# Patient Record
Sex: Female | Born: 1980 | Race: Black or African American | Hispanic: No | Marital: Married | Smoking: Current every day smoker
Health system: Southern US, Community
[De-identification: ages and names within clinical notes are randomized; demographics above are authoritative.]

## PROBLEM LIST (undated history)

## (undated) DIAGNOSIS — K529 Noninfective gastroenteritis and colitis, unspecified: Secondary | ICD-10-CM

## (undated) DIAGNOSIS — I10 Essential (primary) hypertension: Secondary | ICD-10-CM

## (undated) DIAGNOSIS — D649 Anemia, unspecified: Secondary | ICD-10-CM

## (undated) DIAGNOSIS — R252 Cramp and spasm: Secondary | ICD-10-CM

## (undated) HISTORY — PX: OVARIAN CYST REMOVAL: SHX89

## (undated) HISTORY — DX: Cramp and spasm: R25.2

## (undated) HISTORY — DX: Essential (primary) hypertension: I10

## (undated) HISTORY — DX: Noninfective gastroenteritis and colitis, unspecified: K52.9

---
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
## 2000-02-13 ENCOUNTER — Emergency Department (HOSPITAL_COMMUNITY): Admission: EM | Admit: 2000-02-13 | Discharge: 2000-02-13 | Payer: Self-pay | Admitting: Emergency Medicine

## 2000-07-18 ENCOUNTER — Emergency Department (HOSPITAL_COMMUNITY): Admission: EM | Admit: 2000-07-18 | Discharge: 2000-07-18 | Payer: Self-pay | Admitting: Emergency Medicine

## 2000-07-18 ENCOUNTER — Encounter: Payer: Self-pay | Admitting: Emergency Medicine

## 2000-08-12 ENCOUNTER — Encounter: Payer: Self-pay | Admitting: Emergency Medicine

## 2000-08-12 ENCOUNTER — Emergency Department (HOSPITAL_COMMUNITY): Admission: EM | Admit: 2000-08-12 | Discharge: 2000-08-12 | Payer: Self-pay | Admitting: Emergency Medicine

## 2000-09-27 ENCOUNTER — Emergency Department (HOSPITAL_COMMUNITY): Admission: EM | Admit: 2000-09-27 | Discharge: 2000-09-27 | Payer: Self-pay | Admitting: Emergency Medicine

## 2000-10-02 ENCOUNTER — Emergency Department (HOSPITAL_COMMUNITY): Admission: EM | Admit: 2000-10-02 | Discharge: 2000-10-02 | Payer: Self-pay | Admitting: Emergency Medicine

## 2000-11-20 ENCOUNTER — Inpatient Hospital Stay (HOSPITAL_COMMUNITY): Admission: AD | Admit: 2000-11-20 | Discharge: 2000-11-20 | Payer: Self-pay | Admitting: *Deleted

## 2000-11-29 ENCOUNTER — Other Ambulatory Visit: Admission: RE | Admit: 2000-11-29 | Discharge: 2000-11-29 | Payer: Self-pay | Admitting: Gynecology

## 2001-02-13 ENCOUNTER — Inpatient Hospital Stay (HOSPITAL_COMMUNITY): Admission: AD | Admit: 2001-02-13 | Discharge: 2001-02-13 | Payer: Self-pay | Admitting: *Deleted

## 2001-02-28 ENCOUNTER — Emergency Department (HOSPITAL_COMMUNITY): Admission: EM | Admit: 2001-02-28 | Discharge: 2001-02-28 | Payer: Self-pay | Admitting: Emergency Medicine

## 2001-07-26 ENCOUNTER — Other Ambulatory Visit: Admission: RE | Admit: 2001-07-26 | Discharge: 2001-07-26 | Payer: Self-pay | Admitting: Obstetrics and Gynecology

## 2001-10-05 ENCOUNTER — Emergency Department (HOSPITAL_COMMUNITY): Admission: EM | Admit: 2001-10-05 | Discharge: 2001-10-05 | Payer: Self-pay | Admitting: Emergency Medicine

## 2001-10-07 ENCOUNTER — Ambulatory Visit (HOSPITAL_COMMUNITY): Admission: RE | Admit: 2001-10-07 | Discharge: 2001-10-07 | Payer: Self-pay

## 2001-10-21 ENCOUNTER — Emergency Department (HOSPITAL_COMMUNITY): Admission: EM | Admit: 2001-10-21 | Discharge: 2001-10-21 | Payer: Self-pay | Admitting: Emergency Medicine

## 2001-10-22 ENCOUNTER — Encounter: Payer: Self-pay | Admitting: Emergency Medicine

## 2001-12-05 ENCOUNTER — Emergency Department (HOSPITAL_COMMUNITY): Admission: EM | Admit: 2001-12-05 | Discharge: 2001-12-05 | Payer: Self-pay | Admitting: Emergency Medicine

## 2001-12-05 ENCOUNTER — Encounter: Payer: Self-pay | Admitting: Emergency Medicine

## 2001-12-07 ENCOUNTER — Emergency Department (HOSPITAL_COMMUNITY): Admission: EM | Admit: 2001-12-07 | Discharge: 2001-12-07 | Payer: Self-pay | Admitting: Emergency Medicine

## 2002-01-10 ENCOUNTER — Emergency Department (HOSPITAL_COMMUNITY): Admission: EM | Admit: 2002-01-10 | Discharge: 2002-01-10 | Payer: Self-pay | Admitting: Emergency Medicine

## 2002-01-30 ENCOUNTER — Emergency Department (HOSPITAL_COMMUNITY): Admission: EM | Admit: 2002-01-30 | Discharge: 2002-01-30 | Payer: Self-pay | Admitting: Emergency Medicine

## 2002-04-22 ENCOUNTER — Other Ambulatory Visit: Admission: RE | Admit: 2002-04-22 | Discharge: 2002-04-22 | Payer: Self-pay | Admitting: Obstetrics and Gynecology

## 2002-09-14 ENCOUNTER — Emergency Department (HOSPITAL_COMMUNITY): Admission: EM | Admit: 2002-09-14 | Discharge: 2002-09-15 | Payer: Self-pay

## 2002-09-15 ENCOUNTER — Emergency Department (HOSPITAL_COMMUNITY): Admission: EM | Admit: 2002-09-15 | Discharge: 2002-09-15 | Payer: Self-pay | Admitting: Emergency Medicine

## 2002-09-15 ENCOUNTER — Encounter: Payer: Self-pay | Admitting: Emergency Medicine

## 2002-10-23 ENCOUNTER — Other Ambulatory Visit: Admission: RE | Admit: 2002-10-23 | Discharge: 2002-10-23 | Payer: Self-pay | Admitting: Gynecology

## 2002-12-27 ENCOUNTER — Emergency Department (HOSPITAL_COMMUNITY): Admission: EM | Admit: 2002-12-27 | Discharge: 2002-12-27 | Payer: Self-pay | Admitting: Emergency Medicine

## 2002-12-27 ENCOUNTER — Encounter: Payer: Self-pay | Admitting: Emergency Medicine

## 2003-01-01 ENCOUNTER — Emergency Department (HOSPITAL_COMMUNITY): Admission: EM | Admit: 2003-01-01 | Discharge: 2003-01-01 | Payer: Self-pay | Admitting: Emergency Medicine

## 2003-01-01 ENCOUNTER — Encounter: Payer: Self-pay | Admitting: Emergency Medicine

## 2003-01-01 ENCOUNTER — Emergency Department (HOSPITAL_COMMUNITY): Admission: EM | Admit: 2003-01-01 | Discharge: 2003-01-01 | Payer: Self-pay | Admitting: *Deleted

## 2003-01-15 ENCOUNTER — Encounter: Admission: RE | Admit: 2003-01-15 | Discharge: 2003-01-15 | Payer: Self-pay | Admitting: Internal Medicine

## 2003-02-16 ENCOUNTER — Ambulatory Visit (HOSPITAL_COMMUNITY): Admission: RE | Admit: 2003-02-16 | Discharge: 2003-02-16 | Payer: Self-pay | Admitting: Internal Medicine

## 2003-02-16 ENCOUNTER — Encounter: Payer: Self-pay | Admitting: Internal Medicine

## 2003-02-25 ENCOUNTER — Encounter: Admission: RE | Admit: 2003-02-25 | Discharge: 2003-02-25 | Payer: Self-pay | Admitting: Internal Medicine

## 2003-03-04 ENCOUNTER — Encounter: Admission: RE | Admit: 2003-03-04 | Discharge: 2003-03-04 | Payer: Self-pay | Admitting: Internal Medicine

## 2003-05-28 ENCOUNTER — Other Ambulatory Visit: Admission: RE | Admit: 2003-05-28 | Discharge: 2003-05-28 | Payer: Self-pay | Admitting: Gynecology

## 2003-06-14 ENCOUNTER — Emergency Department (HOSPITAL_COMMUNITY): Admission: EM | Admit: 2003-06-14 | Discharge: 2003-06-15 | Payer: Self-pay | Admitting: Emergency Medicine

## 2003-08-22 ENCOUNTER — Emergency Department (HOSPITAL_COMMUNITY): Admission: EM | Admit: 2003-08-22 | Discharge: 2003-08-22 | Payer: Self-pay | Admitting: *Deleted

## 2003-11-22 ENCOUNTER — Inpatient Hospital Stay (HOSPITAL_COMMUNITY): Admission: AD | Admit: 2003-11-22 | Discharge: 2003-11-22 | Payer: Self-pay | Admitting: Gynecology

## 2003-12-13 ENCOUNTER — Inpatient Hospital Stay (HOSPITAL_COMMUNITY): Admission: AD | Admit: 2003-12-13 | Discharge: 2003-12-13 | Payer: Self-pay | Admitting: Gynecology

## 2003-12-15 ENCOUNTER — Other Ambulatory Visit: Admission: RE | Admit: 2003-12-15 | Discharge: 2003-12-15 | Payer: Self-pay | Admitting: Gynecology

## 2004-01-22 ENCOUNTER — Emergency Department (HOSPITAL_COMMUNITY): Admission: EM | Admit: 2004-01-22 | Discharge: 2004-01-22 | Payer: Self-pay | Admitting: Emergency Medicine

## 2004-03-01 ENCOUNTER — Inpatient Hospital Stay (HOSPITAL_COMMUNITY): Admission: AD | Admit: 2004-03-01 | Discharge: 2004-03-02 | Payer: Self-pay | Admitting: Gynecology

## 2004-05-06 ENCOUNTER — Inpatient Hospital Stay (HOSPITAL_COMMUNITY): Admission: AD | Admit: 2004-05-06 | Discharge: 2004-05-07 | Payer: Self-pay | Admitting: Gynecology

## 2004-05-13 ENCOUNTER — Inpatient Hospital Stay (HOSPITAL_COMMUNITY): Admission: AD | Admit: 2004-05-13 | Discharge: 2004-05-14 | Payer: Self-pay | Admitting: Gynecology

## 2004-06-13 ENCOUNTER — Inpatient Hospital Stay (HOSPITAL_COMMUNITY): Admission: AD | Admit: 2004-06-13 | Discharge: 2004-06-13 | Payer: Self-pay | Admitting: Gynecology

## 2004-07-13 ENCOUNTER — Encounter (INDEPENDENT_AMBULATORY_CARE_PROVIDER_SITE_OTHER): Payer: Self-pay | Admitting: *Deleted

## 2004-07-13 ENCOUNTER — Inpatient Hospital Stay (HOSPITAL_COMMUNITY): Admission: AD | Admit: 2004-07-13 | Discharge: 2004-07-16 | Payer: Self-pay | Admitting: Gynecology

## 2004-07-20 ENCOUNTER — Encounter: Admission: RE | Admit: 2004-07-20 | Discharge: 2004-08-19 | Payer: Self-pay | Admitting: Gynecology

## 2004-08-26 ENCOUNTER — Other Ambulatory Visit: Admission: RE | Admit: 2004-08-26 | Discharge: 2004-08-26 | Payer: Self-pay | Admitting: Gynecology

## 2004-09-17 ENCOUNTER — Encounter: Admission: RE | Admit: 2004-09-17 | Discharge: 2004-10-17 | Payer: Self-pay | Admitting: Gynecology

## 2004-10-20 ENCOUNTER — Emergency Department (HOSPITAL_COMMUNITY): Admission: EM | Admit: 2004-10-20 | Discharge: 2004-10-20 | Payer: Self-pay | Admitting: Emergency Medicine

## 2004-11-17 ENCOUNTER — Encounter: Admission: RE | Admit: 2004-11-17 | Discharge: 2004-12-17 | Payer: Self-pay | Admitting: Gynecology

## 2004-12-04 ENCOUNTER — Emergency Department (HOSPITAL_COMMUNITY): Admission: EM | Admit: 2004-12-04 | Discharge: 2004-12-04 | Payer: Self-pay | Admitting: Emergency Medicine

## 2005-01-17 ENCOUNTER — Encounter: Admission: RE | Admit: 2005-01-17 | Discharge: 2005-02-16 | Payer: Self-pay | Admitting: Gynecology

## 2005-02-17 ENCOUNTER — Encounter: Admission: RE | Admit: 2005-02-17 | Discharge: 2005-03-18 | Payer: Self-pay | Admitting: Gynecology

## 2005-03-19 ENCOUNTER — Encounter: Admission: RE | Admit: 2005-03-19 | Discharge: 2005-04-18 | Payer: Self-pay | Admitting: Gynecology

## 2005-04-19 ENCOUNTER — Encounter: Admission: RE | Admit: 2005-04-19 | Discharge: 2005-05-18 | Payer: Self-pay | Admitting: Gynecology

## 2005-05-19 ENCOUNTER — Emergency Department (HOSPITAL_COMMUNITY): Admission: EM | Admit: 2005-05-19 | Discharge: 2005-05-19 | Payer: Self-pay | Admitting: Emergency Medicine

## 2005-05-19 ENCOUNTER — Encounter: Admission: RE | Admit: 2005-05-19 | Discharge: 2005-06-18 | Payer: Self-pay | Admitting: Gynecology

## 2005-05-27 ENCOUNTER — Inpatient Hospital Stay (HOSPITAL_COMMUNITY): Admission: AD | Admit: 2005-05-27 | Discharge: 2005-05-27 | Payer: Self-pay | Admitting: *Deleted

## 2005-06-19 ENCOUNTER — Encounter: Admission: RE | Admit: 2005-06-19 | Discharge: 2005-07-19 | Payer: Self-pay | Admitting: Gynecology

## 2005-06-19 ENCOUNTER — Inpatient Hospital Stay (HOSPITAL_COMMUNITY): Admission: AD | Admit: 2005-06-19 | Discharge: 2005-06-19 | Payer: Self-pay | Admitting: Obstetrics & Gynecology

## 2005-07-10 ENCOUNTER — Ambulatory Visit (HOSPITAL_COMMUNITY): Admission: RE | Admit: 2005-07-10 | Discharge: 2005-07-10 | Payer: Self-pay | Admitting: Obstetrics

## 2005-07-20 ENCOUNTER — Encounter: Admission: RE | Admit: 2005-07-20 | Discharge: 2005-08-16 | Payer: Self-pay | Admitting: Gynecology

## 2005-08-17 ENCOUNTER — Encounter: Admission: RE | Admit: 2005-08-17 | Discharge: 2005-09-16 | Payer: Self-pay | Admitting: Gynecology

## 2005-08-30 ENCOUNTER — Emergency Department (HOSPITAL_COMMUNITY): Admission: EM | Admit: 2005-08-30 | Discharge: 2005-08-30 | Payer: Self-pay | Admitting: Family Medicine

## 2005-09-15 ENCOUNTER — Inpatient Hospital Stay (HOSPITAL_COMMUNITY): Admission: AD | Admit: 2005-09-15 | Discharge: 2005-09-16 | Payer: Self-pay | Admitting: Obstetrics

## 2005-09-17 ENCOUNTER — Encounter: Admission: RE | Admit: 2005-09-17 | Discharge: 2005-10-06 | Payer: Self-pay | Admitting: Gynecology

## 2005-10-26 ENCOUNTER — Ambulatory Visit (HOSPITAL_COMMUNITY): Admission: RE | Admit: 2005-10-26 | Discharge: 2005-10-26 | Payer: Self-pay | Admitting: Obstetrics & Gynecology

## 2005-12-04 ENCOUNTER — Inpatient Hospital Stay (HOSPITAL_COMMUNITY): Admission: AD | Admit: 2005-12-04 | Discharge: 2005-12-05 | Payer: Self-pay | Admitting: Obstetrics

## 2005-12-11 ENCOUNTER — Ambulatory Visit (HOSPITAL_COMMUNITY): Admission: RE | Admit: 2005-12-11 | Discharge: 2005-12-11 | Payer: Self-pay | Admitting: Obstetrics & Gynecology

## 2005-12-12 ENCOUNTER — Inpatient Hospital Stay (HOSPITAL_COMMUNITY): Admission: AD | Admit: 2005-12-12 | Discharge: 2005-12-17 | Payer: Self-pay | Admitting: Obstetrics

## 2005-12-14 ENCOUNTER — Encounter (INDEPENDENT_AMBULATORY_CARE_PROVIDER_SITE_OTHER): Payer: Self-pay | Admitting: Specialist
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
## 2006-02-05 ENCOUNTER — Emergency Department (HOSPITAL_COMMUNITY): Admission: EM | Admit: 2006-02-05 | Discharge: 2006-02-05 | Payer: Self-pay | Admitting: Emergency Medicine

## 2006-08-15 ENCOUNTER — Inpatient Hospital Stay (HOSPITAL_COMMUNITY): Admission: AD | Admit: 2006-08-15 | Discharge: 2006-08-16 | Payer: Self-pay | Admitting: Obstetrics

## 2006-08-18 ENCOUNTER — Emergency Department (HOSPITAL_COMMUNITY): Admission: EM | Admit: 2006-08-18 | Discharge: 2006-08-18 | Payer: Self-pay | Admitting: Emergency Medicine

## 2007-05-18 ENCOUNTER — Inpatient Hospital Stay (HOSPITAL_COMMUNITY): Admission: AD | Admit: 2007-05-18 | Discharge: 2007-05-18 | Payer: Self-pay | Admitting: Obstetrics

## 2007-09-02 ENCOUNTER — Inpatient Hospital Stay (HOSPITAL_COMMUNITY): Admission: AD | Admit: 2007-09-02 | Discharge: 2007-09-02 | Payer: Self-pay | Admitting: Obstetrics

## 2007-09-07 ENCOUNTER — Inpatient Hospital Stay (HOSPITAL_COMMUNITY): Admission: AD | Admit: 2007-09-07 | Discharge: 2007-09-07 | Payer: Self-pay | Admitting: Obstetrics

## 2007-10-18 ENCOUNTER — Ambulatory Visit (HOSPITAL_COMMUNITY): Admission: RE | Admit: 2007-10-18 | Discharge: 2007-10-18 | Payer: Self-pay | Admitting: Obstetrics & Gynecology

## 2008-01-08 ENCOUNTER — Ambulatory Visit (HOSPITAL_COMMUNITY): Admission: RE | Admit: 2008-01-08 | Discharge: 2008-01-08 | Payer: Self-pay | Admitting: Obstetrics & Gynecology

## 2008-02-20 ENCOUNTER — Ambulatory Visit: Payer: Self-pay | Admitting: Family Medicine

## 2008-02-20 DIAGNOSIS — R634 Abnormal weight loss: Secondary | ICD-10-CM | POA: Insufficient documentation

## 2008-02-27 ENCOUNTER — Ambulatory Visit: Payer: Self-pay | Admitting: Family Medicine

## 2008-03-12 ENCOUNTER — Inpatient Hospital Stay (HOSPITAL_COMMUNITY): Admission: RE | Admit: 2008-03-12 | Discharge: 2008-03-15 | Payer: Self-pay | Admitting: Obstetrics & Gynecology

## 2008-03-24 ENCOUNTER — Inpatient Hospital Stay (HOSPITAL_COMMUNITY): Admission: AD | Admit: 2008-03-24 | Discharge: 2008-03-24 | Payer: Self-pay | Admitting: Obstetrics

## 2008-09-15 ENCOUNTER — Emergency Department (HOSPITAL_COMMUNITY): Admission: EM | Admit: 2008-09-15 | Discharge: 2008-09-15 | Payer: Self-pay | Admitting: Family Medicine

## 2008-10-31 ENCOUNTER — Emergency Department (HOSPITAL_COMMUNITY): Admission: EM | Admit: 2008-10-31 | Discharge: 2008-10-31 | Payer: Self-pay | Admitting: Family Medicine

## 2008-11-14 ENCOUNTER — Inpatient Hospital Stay (HOSPITAL_COMMUNITY): Admission: AD | Admit: 2008-11-14 | Discharge: 2008-11-14 | Payer: Self-pay | Admitting: Obstetrics & Gynecology

## 2008-11-15 ENCOUNTER — Inpatient Hospital Stay (HOSPITAL_COMMUNITY): Admission: AD | Admit: 2008-11-15 | Discharge: 2008-11-15 | Payer: Self-pay | Admitting: Obstetrics & Gynecology

## 2008-12-31 ENCOUNTER — Inpatient Hospital Stay (HOSPITAL_COMMUNITY): Admission: AD | Admit: 2008-12-31 | Discharge: 2009-01-01 | Payer: Self-pay | Admitting: Obstetrics & Gynecology

## 2008-12-31 ENCOUNTER — Ambulatory Visit: Payer: Self-pay | Admitting: Advanced Practice Midwife

## 2009-03-15 ENCOUNTER — Inpatient Hospital Stay (HOSPITAL_COMMUNITY): Admission: AD | Admit: 2009-03-15 | Discharge: 2009-03-15 | Payer: Self-pay | Admitting: Obstetrics & Gynecology

## 2009-05-21 ENCOUNTER — Inpatient Hospital Stay (HOSPITAL_COMMUNITY): Admission: AD | Admit: 2009-05-21 | Discharge: 2009-05-21 | Payer: Self-pay | Admitting: Obstetrics

## 2009-07-12 ENCOUNTER — Ambulatory Visit: Payer: Self-pay | Admitting: Physician Assistant

## 2009-07-12 ENCOUNTER — Inpatient Hospital Stay (HOSPITAL_COMMUNITY): Admission: AD | Admit: 2009-07-12 | Discharge: 2009-07-12 | Payer: Self-pay | Admitting: Obstetrics & Gynecology

## 2009-09-21 ENCOUNTER — Emergency Department (HOSPITAL_COMMUNITY): Admission: EM | Admit: 2009-09-21 | Discharge: 2009-09-22 | Payer: Self-pay | Admitting: Emergency Medicine

## 2009-12-05 ENCOUNTER — Ambulatory Visit: Payer: Self-pay | Admitting: Nurse Practitioner

## 2009-12-05 ENCOUNTER — Inpatient Hospital Stay (HOSPITAL_COMMUNITY): Admission: AD | Admit: 2009-12-05 | Discharge: 2009-12-06 | Payer: Self-pay | Admitting: Family Medicine

## 2010-04-19 ENCOUNTER — Inpatient Hospital Stay (HOSPITAL_COMMUNITY): Admission: AD | Admit: 2010-04-19 | Discharge: 2010-04-19 | Payer: Self-pay | Admitting: Obstetrics & Gynecology

## 2010-04-20 ENCOUNTER — Inpatient Hospital Stay (HOSPITAL_COMMUNITY): Admission: AD | Admit: 2010-04-20 | Discharge: 2010-04-20 | Payer: Self-pay | Admitting: Obstetrics & Gynecology

## 2010-08-16 LAB — URINALYSIS, ROUTINE W REFLEX MICROSCOPIC
Bilirubin Urine: NEGATIVE
Glucose, UA: NEGATIVE mg/dL
Ketones, ur: NEGATIVE mg/dL
Leukocytes, UA: NEGATIVE
Nitrite: NEGATIVE
Protein, ur: NEGATIVE mg/dL
Specific Gravity, Urine: >1.03 — ABNORMAL HIGH (ref 1.005–1.030)
Urobilinogen, UA: 0.2 mg/dL (ref 0.0–1.0)
pH: 6 (ref 5.0–8.0)

## 2010-08-16 LAB — GC/CHLAMYDIA PROBE AMP, GENITAL
Chlamydia, DNA Probe: NEGATIVE
GC Probe Amp, Genital: NEGATIVE

## 2010-08-16 LAB — WET PREP, GENITAL
Clue Cells Wet Prep HPF POC: NONE SEEN
Trich, Wet Prep: NONE SEEN

## 2010-08-16 LAB — COMPREHENSIVE METABOLIC PANEL
ALT: 11 U/L (ref 0–35)
AST: 15 U/L (ref 0–37)
Albumin: 3.4 g/dL — ABNORMAL LOW (ref 3.5–5.2)
Calcium: 9.4 mg/dL (ref 8.4–10.5)
GFR calc Af Amer: >60 mL/min (ref >60)
Sodium: 137 mEq/L (ref 135–145)
Total Protein: 6.9 g/dL (ref 6.0–8.3)

## 2010-08-16 LAB — CBC
Hemoglobin: 11 g/dL — ABNORMAL LOW (ref 12.0–15.0)
MCHC: 31.9 g/dL (ref 30.0–36.0)
Platelets: 285 10*3/uL (ref 150–400)
RDW: 15.9 % — ABNORMAL HIGH (ref 11.5–15.5)

## 2010-08-16 LAB — URINE MICROSCOPIC-ADD ON

## 2010-08-21 LAB — CBC
Hemoglobin: 10.6 g/dL — ABNORMAL LOW (ref 12.0–15.0)
MCH: 22.1 pg — ABNORMAL LOW (ref 26.0–34.0)
MCV: 68.5 fL — ABNORMAL LOW (ref 78.0–100.0)
RBC: 4.81 MIL/uL (ref 3.87–5.11)
WBC: 5.3 10*3/uL (ref 4.0–10.5)

## 2010-08-21 LAB — URINALYSIS, ROUTINE W REFLEX MICROSCOPIC
Bilirubin Urine: NEGATIVE
Glucose, UA: NEGATIVE mg/dL
Ketones, ur: NEGATIVE mg/dL
pH: 6 (ref 5.0–8.0)

## 2010-08-21 LAB — WET PREP, GENITAL: Trich, Wet Prep: NONE SEEN

## 2010-08-21 LAB — COMPREHENSIVE METABOLIC PANEL
AST: 15 U/L (ref 0–37)
Alkaline Phosphatase: 78 U/L (ref 39–117)
BUN: 14 mg/dL (ref 6–23)
CO2: 30 mEq/L (ref 19–32)
Chloride: 106 mEq/L (ref 96–112)
Creatinine, Ser: 0.67 mg/dL (ref 0.4–1.2)
GFR calc Af Amer: >60 mL/min (ref >60)
GFR calc non Af Amer: >60 mL/min (ref >60)
Potassium: 4.1 mEq/L (ref 3.5–5.1)
Total Bilirubin: 0.4 mg/dL (ref 0.3–1.2)

## 2010-08-21 LAB — DIFFERENTIAL
Basophils Absolute: 0 10*3/uL (ref 0.0–0.1)
Basophils Relative: 1 % (ref 0–1)
Eosinophils Absolute: 0.1 10*3/uL (ref 0.0–0.7)
Eosinophils Relative: 3 % (ref 0–5)
Lymphocytes Relative: 29 % (ref 12–46)
Monocytes Absolute: 0.5 10*3/uL (ref 0.1–1.0)

## 2010-08-21 LAB — GC/CHLAMYDIA PROBE AMP, GENITAL: GC Probe Amp, Genital: NEGATIVE

## 2010-08-24 LAB — URINALYSIS, ROUTINE W REFLEX MICROSCOPIC
Glucose, UA: NEGATIVE mg/dL
Hgb urine dipstick: NEGATIVE
Specific Gravity, Urine: 1.02 (ref 1.005–1.030)
pH: 7 (ref 5.0–8.0)

## 2010-08-24 LAB — DIFFERENTIAL
Basophils Absolute: 0 10*3/uL (ref 0.0–0.1)
Basophils Relative: 1 % (ref 0–1)
Eosinophils Absolute: 0.4 10*3/uL (ref 0.0–0.7)
Monocytes Absolute: 0.5 10*3/uL (ref 0.1–1.0)
Monocytes Relative: 10 % (ref 3–12)
Neutro Abs: 2.2 10*3/uL (ref 1.7–7.7)
Neutrophils Relative %: 46 % (ref 43–77)

## 2010-08-24 LAB — COMPREHENSIVE METABOLIC PANEL
ALT: 11 U/L (ref 0–35)
Alkaline Phosphatase: 55 U/L (ref 39–117)
BUN: 9 mg/dL (ref 6–23)
Chloride: 105 mEq/L (ref 96–112)
Glucose, Bld: 91 mg/dL (ref 70–99)
Potassium: 3.7 mEq/L (ref 3.5–5.1)
Sodium: 138 mEq/L (ref 135–145)
Total Bilirubin: 0.2 mg/dL — ABNORMAL LOW (ref 0.3–1.2)
Total Protein: 6.5 g/dL (ref 6.0–8.3)

## 2010-08-24 LAB — CBC
HCT: 31.7 % — ABNORMAL LOW (ref 36.0–46.0)
Hemoglobin: 9.8 g/dL — ABNORMAL LOW (ref 12.0–15.0)
RBC: 4.34 MIL/uL (ref 3.87–5.11)
RDW: 17.7 % — ABNORMAL HIGH (ref 11.5–15.5)
WBC: 4.8 10*3/uL (ref 4.0–10.5)

## 2010-08-24 LAB — POCT PREGNANCY, URINE: Preg Test, Ur: NEGATIVE

## 2010-09-05 LAB — URINALYSIS, ROUTINE W REFLEX MICROSCOPIC
Bilirubin Urine: NEGATIVE
Hgb urine dipstick: NEGATIVE
Nitrite: NEGATIVE
Protein, ur: NEGATIVE mg/dL
Urobilinogen, UA: 0.2 mg/dL (ref 0.0–1.0)

## 2010-09-05 LAB — WET PREP, GENITAL: Trich, Wet Prep: NONE SEEN

## 2010-09-08 LAB — POCT PREGNANCY, URINE: Preg Test, Ur: NEGATIVE

## 2010-09-08 LAB — URINALYSIS, ROUTINE W REFLEX MICROSCOPIC
Nitrite: NEGATIVE
Protein, ur: NEGATIVE mg/dL
Specific Gravity, Urine: 1.02 (ref 1.005–1.030)
Urobilinogen, UA: 1 mg/dL (ref 0.0–1.0)

## 2010-09-08 LAB — WET PREP, GENITAL: Trich, Wet Prep: NONE SEEN

## 2010-09-11 LAB — WET PREP, GENITAL: Trich, Wet Prep: NONE SEEN

## 2010-09-11 LAB — URINALYSIS, ROUTINE W REFLEX MICROSCOPIC
Leukocytes, UA: NEGATIVE
Nitrite: NEGATIVE
Protein, ur: NEGATIVE mg/dL
Specific Gravity, Urine: 1.02 (ref 1.005–1.030)
Urobilinogen, UA: 0.2 mg/dL (ref 0.0–1.0)

## 2010-09-11 LAB — URINE MICROSCOPIC-ADD ON: WBC, UA: NONE SEEN WBC/hpf (ref <3)

## 2010-09-11 LAB — GC/CHLAMYDIA PROBE AMP, GENITAL: Chlamydia, DNA Probe: NEGATIVE

## 2010-09-12 LAB — URINALYSIS, ROUTINE W REFLEX MICROSCOPIC
Glucose, UA: NEGATIVE mg/dL
Protein, ur: NEGATIVE mg/dL
Specific Gravity, Urine: 1.02 (ref 1.005–1.030)
Urobilinogen, UA: 0.2 mg/dL (ref 0.0–1.0)

## 2010-09-12 LAB — WET PREP, GENITAL
WBC, Wet Prep HPF POC: NONE SEEN
Yeast Wet Prep HPF POC: NONE SEEN

## 2010-09-12 LAB — POCT PREGNANCY, URINE: Preg Test, Ur: NEGATIVE

## 2010-09-13 LAB — POCT URINALYSIS DIP (DEVICE)
Nitrite: NEGATIVE
Protein, ur: 30 mg/dL — AB
pH: 6.5 (ref 5.0–8.0)

## 2010-09-13 LAB — POCT PREGNANCY, URINE: Preg Test, Ur: NEGATIVE

## 2010-10-18 NOTE — H&P (Signed)
NAMEKHRISTINA, [name] NO.:  [id number]   MEDICAL RECORD NO.:  [id number]          PATIENT TYPE:  INP   LOCATION:                                FACILITY[facility]  [facility]   PHYSICIAN:  Roseanna Rainbow, M.D.DATE OF BIRTH:  May 17, 1981   DATE OF ADMISSION:  03/12/2008  DATE OF DISCHARGE:                              HISTORY & PHYSICAL   CHIEF COMPLAINT:  The patient is a 30 year old para 2 with an estimated  date of confinement of October 11 with an intrauterine pregnancy at 39+  weeks for elective repeat cesarean delivery.   HISTORY OF PRESENT ILLNESS:  Please see the above.   ALLERGIES:  No known drug allergies.   MEDICATIONS:  Please see the medication reconciliation form.   CURRENT OBSTETRICAL RISK FACTORS:  Herpes type 2, uterine fibroids,  history of previous cesarean deliveries, chlamydial infection.   PAST OBSTETRICAL HISTORY:  IN February 2006 she was delivered of a live  born female, full term, 6 pounds 1 ounce via cesarean delivery for  failure to dilate.  In July of 2007 she had a failed a trial of labor.  She was delivered of a live born female 7 pounds 14 ounces via cesarean  delivery.   PRENATAL LABORATORIES:  Blood type is O+, antibody screen negative.  Chlamydia DNA probe in May was positive.  Test of cure in September was  negative.  Urine culture and sensitivity insignificant growth.  Pap  smear negative.  GC probe negative.  1-hour GCT 75.  Hepatitis B surface  antigen negative, hematocrit 31, hemoglobin 9.8, HIV nonreactive.  Quad  screen negative, platelets 288,000, RPR nonreactive, rubella immune.  Sickle cell negative.  Varicella immune.   PAST GYNECOLOGICAL HISTORY:  1. Ovarian cystectomy.  2. History of abnormal Pap smears.  3. Colposcopy.   PAST MEDICAL HISTORY:  Anemia.   PAST SURGICAL HISTORY:  Please see the above.   SOCIAL HISTORY:  She is a Lawyer.  She is single, does not give any  significant history of alcohol usage.   Previously smoked less than 1/2  pack per day.  There is a history of occasional marijuana use in the  past.   FAMILY HISTORY:  Remarkable for adult-onset diabetes.   PHYSICAL EXAM:  VITAL SIGNS: Blood pressure 113/65, weight 162 pounds.  Fetal heart rate by Doppler 140.  GENERAL:  Well-developed, well-nourished, no apparent distress.  ABDOMEN:  Gravid, nontender.  PELVIC:  Exam deferred.   ASSESSMENT:  Multipara at term with a history of two previous cesarean  deliveries, desires a sterilization procedure.   PLAN:  1. Admission.  2. Repeat cesarean delivery.  The risks, benefits and alternative      forms of management were reviewed with the patient and informed      consent had been obtained.      Roseanna Rainbow, M.D.  Electronically Signed     LAJ/MEDQ  D:  03/11/2008  T:  03/11/2008  Job:  [id number]

## 2010-10-18 NOTE — Op Note (Signed)
[name], [name]              ACCOUNT NO.:  [id number]   MEDICAL RECORD NO.:  [id number]          PATIENT TYPE:  INP   LOCATION:  9141                          FACILITY:  [facility]   PHYSICIAN:  Roseanna Rainbow, M.D.DATE OF BIRTH:  1980/12/12   DATE OF PROCEDURE:  03/12/2008  DATE OF DISCHARGE:                               OPERATIVE REPORT   PREOPERATIVE DIAGNOSES:  Intrauterine pregnancy at term, history of 2  previous cesarean delivery.   POSTOPERATIVE DIAGNOSES:  Intrauterine pregnancy at term, history of 2  previous cesarean delivery, hypertrophic scar.   PROCEDURE:  Repeat cesarean delivery and scar revision.   SURGEON:  Roseanna Rainbow, MD   ANESTHESIA:  Spinal.   ESTIMATED BLOOD LOSS:  600 mL.   COMPLICATIONS:  None.   PROCEDURE:  The patient was taken to the operating room with an IV  running.  She was placed in the dorsal supine position with a leftward  tilt and prepped and draped in the usual sterile fashion.  After a time-  out had been completed, the previous scar was excised with the scalpel.  The incision was then extended down to the fascia with the Bovie.  The  fascia was then incised along the length of the incision.  The superior  aspect of the fascial incision was tented up and the underlying rectus  muscles dissected off.  The inferior aspect of the fascial incision was  manipulated in a similar fashion.  The rectus muscles were separated in  the midline.  The parietal peritoneum was tented up and entered sharply.  This incision was then extended superiorly and inferiorly with good  visualization of the bladder.  The bladder blade was placed.  The  vesicouterine peritoneum was tented up and entered sharply.  This  incision was then extended bilaterally and the bladder flap created  sharply.  The lower uterine segment was incised in a transverse fashion  with a scalpel.  This incision was then extended bluntly.  The infant's  head was then  delivered with vacuum assistance.  The infant's head was  delivered atraumatically.  The oropharynx was suctioned with bulb  suction.  The cord was clamped and cut.  The infant was handed off to  the awaiting neonatologist.  The placenta was then removed.  The  intrauterine cavity was evacuated of any remaining amniotic fluid, clots  and debris with moist laparotomy sponge.  The uterine incision was then  reapproximated in a running interlocking fashion using a suture of 0  Monocryl.  A second imbricating layer of the same suture was then placed  for adequate hemostasis.  The paracolic gutters were then copiously  irrigated.  The parietal peritoneum was then reapproximated in a running  fashion using a suture of 2-0 Vicryl.  The fascia was closed in a  running fashion using 2 sutures of 0 PDS.  The skin was closed in a  subcuticular fashion using suture of 3-0 Monocryl.  At the close of the  procedure, the instrument pack counts were said to be correct x2.  The  patient was taken to the  PACU awake and in stable condition.      Roseanna Rainbow, M.D.  Electronically Signed     LAJ/MEDQ  D:  03/12/2008  T:  03/12/2008  Job:  [id number]

## 2010-10-21 NOTE — Op Note (Signed)
NAMEGENEVIEVE, ARBAUGH              ACCOUNT NO.:  [id number]   MEDICAL RECORD NO.:  [id number]          PATIENT TYPE:  INP   LOCATION:  9171                          [facility]ILITY[facility]   PHYSICIAN:  Roseanna Rainbow, M.D.DATE OF BIRTH:  02/12/1981   DATE OF PROCEDURE:  12/14/2005  DATE OF DISCHARGE:                                 OPERATIVE REPORT   PREOPERATIVE DIAGNOSES:  1.  Intrauterine pregnancy term.  2.  Failed induction of labor.  3.  History of previous cesarean delivery.  4.  Keloid of previous skin incision.   POSTOPERATIVE DIAGNOSIS:  1.  Intrauterine pregnancy term.  2.  Failed induction of labor.  3.  History of previous cesarean delivery.  4.  Keloid.  5.  Repeat cesarean delivery.  6.  Scar revision.   SURGEON:  Dr. Tamela Oddi.   ANESTHESIA:  Epidural.   PATHOLOGY:  Placenta.   ESTIMATED BLOOD LOSS:  600 mL.   COMPLICATIONS:  None.   PROCEDURE:  The patient was taken to the operating room with an epidural  catheter in site.  The epidural was dosed.  She was placed in the dorsal  supine position with left tilt and prepped and draped in the usual sterile  fashion.  The previous keloid scar was excised.  The incision was extended  down to the fascia with Bovie.  The fascia was nicked in the midline.  The  fascial incision was then extended bilaterally with curved Mayo scissors.  The superior aspect of the fascial incision was tented up, and the  underlying rectus muscles dissected off.  The inferior aspect of the fascial  incision was manipulated in a similar fashion.  The rectus muscles were  separated in the midline.  The parietoperitoneum was tented up and entered  sharply with Metzenbaum scissors.  This incision was then extended  superiorly and inferiorly with good visualization of the bladder.  A bladder  blade was then placed.  The vesicouterine peritoneum was tented up and  entered sharply with the scissors.  This incision was then extended  bilaterally, and the bladder flap was created sharply.  Please note this  lower uterine segment was very thin and attenuated.  The lower uterine  segment was then incised in a transverse fashion with the scalpel.  This  incision was then extended bluntly.  The infant head was delivered  atraumatically.  The oropharynx was suctioned with bulb suction.  Cord was  clamped and cut.  The infant was handed off to the waiting neonatologist.  She was delivered of a live female, weight 7 pounds 14 ounces.  Apgars 9 at  1 and 5 minutes, respectively.  The placenta was then removed.  The  intrauterine cavity was evacuated of any remaining amniotic fluid, clots and  debris with laparotomy sponge.  The uterine incision was then reapproximated  in a running interlocking fashion using #0 Monocryl.  A second imbricating  layer was then placed using a suture of the same.  The paracolic gutters  were then irrigated, and the parietoperitoneum was reapproximated in running  fashion, 2-0 Vicryl.  The  fascia was reapproximated in a running fashion  with #0 PDS.  At this point the subcutaneous layer incision was infiltrated  with Kenalog mixed with 0.25% Marcaine.  The skin was then reapproximated in  a subcuticular fashion using 3-0 Monocryl.  At the close of the procedure,  the instrument and pad counts were said to be correct x2.  Cephazolin 1 gram  had been given at cord clamp.  The patient was taken to the PACU awake and  in stable condition.      Roseanna Rainbow, M.D.  Electronically Signed     LAJ/MEDQ  D:  12/14/2005  T:  12/14/2005  Job:  [id number]

## 2010-10-21 NOTE — Consult Note (Signed)
[name], [name]              ACCOUNT NO.:  [id number]   MEDICAL RECORD NO.:  [id number]          PATIENT TYPE:  MAT   LOCATION:  [facility]C                          [facility]ILITY:  [facility]   PHYSICIAN:  Juan H. Lily Peer, M.D.DATE OF BIRTH:  [birth year]   DATE OF CONSULTATION:  03/02/2004  DATE OF DISCHARGE:                                   CONSULTATION   HISTORY:  The patient is a 30 year old, gravida 1, para 0, who presented to  [facility] this morning complaining of lower abdominal discomfort  which later radiated towards her back.   VITAL SIGNS:  Blood pressure of 123/57, temperature 98.4, pulse 72,  respirations 20.   The patient had been placed on the monitor.  There was no evidence of  contractions.  She had a urinalysis that was obtained, clean catch, which  demonstrated a specific gravity of 1.020, and pH of 6.5.  All parameters  were negative.  A wet prep also had been done with a few WBC's and moderate  bacteria, but no yeast or trichomoniasis or evidence of bacterial vaginosis  was evident.  An ultrasound was done, which demonstrated she had a cervical  lymph measurement of 4.8 cm, and she does have a fibroid near the cervical  os measuring 5.1 x 4.2 x 5.1 cm.  Fetal cardiac activity was recorded at 146  beats per minute.  The placenta was anterior, grade 1, with no  abnormalities, and the amniotic fluid index was normal for a gestational age  of [redacted] weeks.   PHYSICAL EXAMINATION:  LUNGS:  Her lungs were clear to auscultation.  HEART:  Regular rate and rhythm.  No murmurs or gallops.  ABDOMEN:  There was no CVA tenderness.  Abdomen was soft.  No rebound or  guarding.  Upon lateral displacement of the uterus, the discomfort can be  reproduced on her left side, consistent with round ligament syndrome.  PELVIC:  Cervix long, closed, and posterior.  EXTREMITIES:  No edema and no clonus.   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  Will continue to monitor this in the office, and, in the event  that it becomes too large or a problem with vaginal delivery, we discussed  that there could be a possibility she could deliver this child when the time  comes via cesarean section.  We will need to readdress this in the office.  She will be given a note to stay off her feet and rest from work for the  next couple of days.  She will begin Ambien 10 mg just like tonight.  I have  given her a prescription for Tylox to take one p.o. q.4-6h., and have given  her 10 tablets.      JHF/MEDQ  D:  03/02/2004  T:  03/02/2004  Job:  [id number]

## 2010-10-21 NOTE — Discharge Summary (Signed)
[name], [name]              ACCOUNT NO.:  [id number]   MEDICAL RECORD NO.:  [id number]          PATIENT TYPE:  INP   LOCATION:  9121                          [facility]ILITY[facility]   PHYSICIAN:  Charles A. Clearance Coots, M.D.DATE OF BIRTH:  1980-06-21   DATE OF ADMISSION:  12/12/2005  DATE OF DISCHARGE:  12/17/2005                                 DISCHARGE SUMMARY   ADMITTING DIAGNOSIS:  Intrauterine pregnancy at term, failed induction of  labor, previous cesarean section.   DISCHARGE DIAGNOSIS:  Intrauterine pregnancy at term, failed induction of  labor, previous cesarean section, status post repeat low transverse cesarean  section on December 13, 2005.  A viable female was delivered at 0718.  Apgars  were 9 at 1 minute and 9 at 5 minutes, weight of 3595 grams, length of 53.5  cm.  Mother and infant were discharged home in good condition.   REASON FOR ADMISSION:  A 30 year old, G2 P10, black female, estimated date of  confinement was December 07, 2005, presented for a prenatal visit.  The patient  was having irregular uterine contractions.  Ultrasound done on December 11, 2005,  revealed AFI of 5.9 cm.  The patient was, therefore, admitted to the  hospital for induction of labor for oligohydramnios and postdate.  She did  have a history of a previous cesarean section and she wanted a trial of  labor.   PAST MEDICAL HISTORY:  Surgery:  Cesarean section, laparoscopy.  Illnesses:  Bronchitis, urinary tract infection, genital herpes.   MEDICATIONS:  Prenatal vitamins and Valtrex.   ALLERGIES:  NO KNOWN DRUG ALLERGIES.   SOCIAL HISTORY:  Single.  Positive tobacco and marijuana.  Negative alcohol.   PHYSICAL EXAMINATION:  GENERAL:  A well-nourished, well-developed female in  no acute distress.  VITAL SIGNS:  Temperature 97, pulse 83, respiratory rate 28, blood pressure  115/62.  LUNGS:  Lung were clear to auscultation bilaterally.  HEART:  Regular rate and rhythm.  ABDOMEN:  Gravid, nontender.  PELVIC:  Cervix 1-2 cm dilated, 50% effaced, vertex at a -3 station.   ADMITTING LABORATORY VALUES:  Hemoglobin 11.4, hematocrit 35.5, white blood  cell count 8300, platelets 288,000.   HOSPITAL COURSE:  The patient was admitted for induction of labor.  Pitocin  was started and she progressed to 1-2 cm and 80% effacement, and at that  time, active rupture of membranes was performed and light meconium-stained  fluid was expelled.  An intrauterine pressure catheter was placed.  The  patient continued in labor.  There was no change in the patient's  examination over the next eight hours.  She continued to have no change in  her examination over approximately 24 hours, and the decision was made to  proceed with cesarean section delivery for failed induction.  Repeat low  transverse cesarean section was performed on December 14, 2005.  There were no  intraoperative complications.  Postoperative course was uncomplicated and  the patient was discharged home on postoperative day #3 in good condition.   DISCHARGE LABORATORY VALUES:  Hemoglobin 8.5, hematocrit 26.3, white blood  cell count 8500, platelets 231,000.  DISCHARGE DISPOSITION:  Medications:  Tylox and ibuprofen were prescribed  for pain, iron was prescribed for anemia which was clinically stable at the  time of discharge, and the patient was also to continue to take her prenatal  vitamins.  Routine written instructions were given for discharge after  cesarean section.  The patient is to call the office for follow-up  appointment in two weeks.      Charles A. Clearance Coots, M.D.  Electronically Signed     CAH/MEDQ  D:  01/25/2006  T:  01/25/2006  Job:  [id number]

## 2010-10-21 NOTE — Op Note (Signed)
NAMEFERNANDE, [name]              ACCOUNT NO.:  [id number]   MEDICAL RECORD NO.:  [id number]          PATIENT TYPE:  INP   LOCATION:  9128                          [facility]ILITY[facility]   PHYSICIAN:  Timothy P. 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   OPERATION PERFORMED:  Primary low transverse cervical cesarean section.   SURGEON:  Timothy P. Fontaine, M.D.   ASSISTANT:  Scrub technician.   ANESTHESIA:  Spinal.   ESTIMATED BLOOD LOSS:  Less than 500 mL.   COMPLICATIONS:  None.   SPECIMENS:  Samples of cord blood, placenta.   FINDINGS:  At 20, normal female, Apgars 9 and 9, nuchal cord times one  noted, 4 cm lower uterine segment anterior fibroid noted.  Left fallopian  tube and ovary grossly normal.  Right fallopian tube absent, could not  identify proximal or distal segment.  Right ovary grossly normal.  No  evidence of pelvic adhesive disease or other pathology.   INDICATIONS FOR PROCEDURE:  The patient is a 30 year old G1, P0, Pitocin  augmented labor contracting every two minutes with reactive fetal tracing.  The patient's cervix has remained unchanged with the vertex unengaged,  cervix fingertip and long.  discussed with the patient and family my fear  was that her leiomyomata was obstructing the head moving into the pelvis and  did not feel continued Pitocin augmentation prudent, given failure to  respond over a four to five hour period.  The patient and family agreed.  She was counseled as to the risks to  include bleeding, transfusion,  infection, wound complications requiring reoperation, opening and draining  of incisions, closure by secondary intention, risks of inadvertent injury to  internal organs including bowel, bladder, ureters, vessels and nerves  necessitating major exploratory reparative surgeries, future reparative  surgeries, the risk of fetal injury, to include musculoskeletal, neural,  scalpel, realistic risks for transfusion, given the leiomyomata as well as  possible classical incision requiring subsequent C-sections as well as risks  of uterine rupture in the future.  All of this was understood and accepted.   DESCRIPTION OF PROCEDURE:  The patient was taken to the operating room and  underwent spinal anesthesia and was placed in the left tilt supine position,  receiving an abdominal preparation with Betadine solution.  Bladder emptied  with indwelling Foley catheter, placed in sterile technique.  The patient  was draped in the usual fashion.  After assuring adequate anesthesia, the  abdomen was sharply entered through a Pfannenstiel incision achieving  adequate hemostasis at all levels.  The bladder flap was sharply and bluntly  developed without difficulty.  The anterior leiomyomata was identified and a  transverse lower uterine incision was made  below the leiomyomata and bluntly  extended laterally.  The bulging membranes were ruptured.  Meconium staining  was noted.  The infant's head delivered through the incision, nuchal cord  reduced.  Nares, oropharynx suctioned using the DeLee and bulb suction.  The  rest of the infant delivered and cord doubly clamped and cut and the infant  was handed to pediatrics in attendance.  Samples of cord blood were  obtained.  The placenta was spontaneously extruded and noted to be intact  and was sent to pathology.  The uterus was exteriorized.  Endometrial cavity  was explored with a sponge to remove all placental membrane fragments.   The  patient received 1 gm Ancef antibiotic prophylaxis at this time.  The  uterine incision was closed in two layers using 0 Vicryl suture first in a  running interlocking stitch followed by an imbricating stitch.  The pelvic  anatomy was inspected.  I was unable to locate the right fallopian tube.  There did not appear to be any proximal or distal segment although a normal-  appearing round ligament and a normal-appearing ovary with utero-ovarian  pedicle was identified.  The uterus was returned to the abdomen which was  copiously irrigated.  Adequate hemostasis visualized.  Anterior fascia  reapproximated using 0 Vicryl suture in a running stitch, subcutaneous  tissues irrigated, adequate hemostasis achieved with electrocautery.  Skin  reapproximated using 4-0 Vicryl in a running subcuticular stitch.  Steri-  Strips and Benzoin applied.  Sterile dressing applied.  The patient was  taken to the recovery room in good condition having tolerated the procedure  well.      TPF/MEDQ  D:  07/13/2004  T:  07/13/2004  Job:  [id number]
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
## 2010-10-21 NOTE — Discharge Summary (Signed)
[name], [name]              ACCOUNT NO.:  [id number]   MEDICAL RECORD NO.:  [id number]          PATIENT TYPE:  INP   LOCATION:  9128                          [facility]ILITY:  [facility]   PHYSICIAN:  Timothy P. Fontaine, M.D.DATE OF BIRTH:  1981-05-02   DATE OF ADMISSION:  07/13/2004  DATE OF DISCHARGE:  07/16/2004                                 DISCHARGE SUMMARY   DISCHARGE DIAGNOSES:  1.  Pregnancy at term.  2.  Failure to progress.  3.  Leiomyomata.  4.  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  5.  Absent right fallopian tube.  6.  Meconium stained fluid.  7.  Nuchal cord x1.   PROCEDURE:  Primary low transverse cervical cesarean section, July 13, 2004.   HOSPITAL COURSE:  A 30 year old, G1, P0 female at term gestation who  presents with a history of rupture of membranes and low grade temperature.  The patient was admitted, found to be 1 cm dilated with prolonged rupture of  membranes.  The patient underwent augmentation and remained unchanged at a  fingertip dilatation, -3 station.  The patient was noted to have an anterior  leiomyomata prenatally through ultrasound evaluation and the fear of  obstructed pelvic outlet due to the fibroid was entertained.  The patient  ultimately underwent a primary cesarean section, by Dr. Colin Broach,  producing normal female, Apgar's 9 and 9.  She did have a 4-5 cm anterior  low uterine segment leiomyomata noted.  She also had an absent right  fallopian tube which was consistent with a past history of salpingectomy.  The patient's postoperative course was complicated by temperatures in the  101-102 range which was thought to be probably secondary to her prolonged  rupture of membranes, possibly a viral URI having cough and upper nasal  congestion noted, although she was begun on antibiotic prophylaxis for  endometriosis.  The patient became quickly afebrile and remained afebrile  prior to discharge and was  discharged on postop day #3, ambulating well,  tolerating a regular diet with a postoperative hemoglobin of 9.7, blood type  O+, rubella titer immune.  The patient received precautions, instructions  and follow-up.   FOLLOW UP:  She will be seen in the office in 6 weeks.   DISCHARGE MEDICATIONS:  Tylox one to two p.o. q.4-6 h p.r.n. pain.      TPF/MEDQ  D:  08/03/2004  T:  08/03/2004  Job:  [id number]
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
## 2010-10-21 NOTE — Consult Note (Signed)
NAMEJATAYA, [name]              ACCOUNT NO.:  [id number]   MEDICAL RECORD NO.:  [id number]          PATIENT TYPE:  INP   LOCATION:  9171                          [facility]   PHYSICIAN:  Timothy P. Fontaine, M.D.DATE OF BIRTH:  August 25, 1980   DATE OF CONSULTATION:  07/13/2004  DATE OF DISCHARGE:                                   CONSULTATION   A 30 year old primigravida in labor.  Admitted earlier with spontaneous  rupture of membranes and meconium staining.  She was begun on Pitocin  augmentation.  An IUPC was placed.  Her current exam is unchanged since  initial admission and is fingertip dilated.  The cervix is long at a -3  station.  I reviewed the situation with the patient and her family.  She has  a large anterior fibroid approximately 4-5 cm in the lower uterine segment  detected prenatally.  My fear is that this has caused an obstruction to  labor and I am concerned about continuing Pitocin stimulation if she has an  obstructed birth canal and I think the most prudent would be to proceed with  a cesarean section.  The risks of cesarean were discussed with her,  particularly with a lower uterine anterior fibroid, the issues of possible  classical incision with subsequent need for cesareans and the issues with  classical were discussed.  Also the issues with hemorrhage related to the  fibroid and vascular changes during the surgery increasing her risk for  transfusion.  The generalized risks of cesarean to include infection, wound  complications, opening and draining of incisions, closure by secondary  intention, bleeding and transfusion, inadvertant injury to internal organs,  including bowel, bladder, ureters, vessels and nerves, necessitating major  exploratory reparative surgeries, future reparative surgeries and the risks  of fetal injury to include musculoskeletal and neural scalpel injuries were  discussed, understood and accepted.  The patient agrees with the plan  as  does her family and we will proceed with a cesarean section.  The Pitocin  was discontinued and we will ready her for surgery.      TPF/MEDQ  D:  07/13/2004  T:  07/13/2004  Job:  [id number]
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
## 2011-02-27 LAB — POCT PREGNANCY, URINE: Preg Test, Ur: POSITIVE

## 2011-02-27 LAB — GC/CHLAMYDIA PROBE AMP, GENITAL
Chlamydia, DNA Probe: NEGATIVE
GC Probe Amp, Genital: NEGATIVE

## 2011-02-27 LAB — WET PREP, GENITAL

## 2011-02-27 LAB — URINALYSIS, ROUTINE W REFLEX MICROSCOPIC
Glucose, UA: NEGATIVE
Hgb urine dipstick: NEGATIVE
Ketones, ur: NEGATIVE
Protein, ur: NEGATIVE

## 2011-02-28 LAB — WET PREP, GENITAL
Trich, Wet Prep: NONE SEEN
Yeast Wet Prep HPF POC: NONE SEEN

## 2011-03-06 LAB — CBC
MCHC: 31.8
MCHC: 32.7
MCV: 75.9 — ABNORMAL LOW
MCV: 76.3 — ABNORMAL LOW
Platelets: 300
RBC: 3.32 — ABNORMAL LOW
RBC: 4.43
RDW: 15.3

## 2011-03-06 LAB — RPR: RPR Ser Ql: NONREACTIVE

## 2011-03-13 LAB — URINALYSIS, ROUTINE W REFLEX MICROSCOPIC
Bilirubin Urine: NEGATIVE
Hgb urine dipstick: NEGATIVE
Specific Gravity, Urine: 1.025
Urobilinogen, UA: 0.2

## 2011-03-13 LAB — POCT PREGNANCY, URINE: Preg Test, Ur: NEGATIVE

## 2011-04-07 ENCOUNTER — Ambulatory Visit (INDEPENDENT_AMBULATORY_CARE_PROVIDER_SITE_OTHER): Payer: Self-pay

## 2011-04-07 ENCOUNTER — Inpatient Hospital Stay (INDEPENDENT_AMBULATORY_CARE_PROVIDER_SITE_OTHER)
Admission: RE | Admit: 2011-04-07 | Discharge: 2011-04-07 | Disposition: A | Discharge status: Home / Self Care | Payer: Self-pay | Source: Ambulatory Visit | Attending: Family Medicine | Admitting: Family Medicine

## 2011-04-07 DIAGNOSIS — S63639A Sprain of interphalangeal joint of unspecified finger, initial encounter: Secondary | ICD-10-CM

## 2013-09-30 ENCOUNTER — Emergency Department (HOSPITAL_COMMUNITY)
Admission: EM | Admit: 2013-09-30 | Discharge: 2013-09-30 | Disposition: A | Discharge status: Home / Self Care | Payer: Self-pay | Attending: Emergency Medicine | Admitting: Emergency Medicine

## 2013-09-30 ENCOUNTER — Encounter (HOSPITAL_COMMUNITY): Payer: Self-pay | Admitting: Emergency Medicine

## 2013-09-30 ENCOUNTER — Emergency Department (HOSPITAL_COMMUNITY): Payer: Self-pay

## 2013-09-30 DIAGNOSIS — Z79899 Other long term (current) drug therapy: Secondary | ICD-10-CM | POA: Insufficient documentation

## 2013-09-30 DIAGNOSIS — F17200 Nicotine dependence, unspecified, uncomplicated: Secondary | ICD-10-CM | POA: Insufficient documentation

## 2013-09-30 DIAGNOSIS — J40 Bronchitis, not specified as acute or chronic: Secondary | ICD-10-CM | POA: Insufficient documentation

## 2013-09-30 MED ORDER — PREDNISONE 20 MG PO TABS
60.0000 mg | ORAL_TABLET | Freq: Once | ORAL | Status: AC
  Administered 2013-09-30: 60 mg via ORAL
  Filled 2013-09-30: qty 3

## 2013-09-30 MED ORDER — ALBUTEROL SULFATE (2.5 MG/3ML) 0.083% IN NEBU
5.0000 mg | INHALATION_SOLUTION | Freq: Once | RESPIRATORY_TRACT | Status: AC
  Administered 2013-09-30: 5 mg via RESPIRATORY_TRACT
  Filled 2013-09-30: qty 6

## 2013-09-30 MED ORDER — PREDNISONE 10 MG PO TABS: ORAL_TABLET | ORAL | Status: DC

## 2013-09-30 MED ORDER — ALBUTEROL SULFATE HFA 108 (90 BASE) MCG/ACT IN AERS
2.0000 | INHALATION_SPRAY | Freq: Once | RESPIRATORY_TRACT | Status: AC
  Administered 2013-09-30: 2 via RESPIRATORY_TRACT
  Filled 2013-09-30: qty 6.7

## 2013-09-30 MED ORDER — IPRATROPIUM BROMIDE 0.02 % IN SOLN
0.5000 mg | Freq: Once | RESPIRATORY_TRACT | Status: AC
  Administered 2013-09-30: 0.5 mg via RESPIRATORY_TRACT
  Filled 2013-09-30: qty 2.5

## 2013-09-30 NOTE — ED Notes (Signed)
Patient is resting comfortably. 

## 2013-09-30 NOTE — Discharge Planning (Signed)
[facility] Felicia E, [employer] Liaison  Spoke to patient about primary care resources and establishing care with a provider. [facility], medicalcard application and resource guide was given. Patient was instructed to contact me for an appointment once application was completed. My contact information was also provided for any future questions or concern. No other needs expressed at this time.

## 2013-09-30 NOTE — Discharge Instructions (Signed)
Take prednisone as prescribed until all gone, next dose tomorrow. Use inhaler 2 puffs every 4 hrs. Take mucinex or robitussin for cough. Follow up closely. Stop smoking.    Bronchitis Bronchitis is inflammation of the airways that extend from the windpipe into the lungs (bronchi). The inflammation often causes mucus to develop, which leads to a cough. If the inflammation becomes severe, it may cause shortness of breath. CAUSES  Bronchitis may be caused by:   Viral infections.   Bacteria.   Cigarette smoke.   Allergens, pollutants, and other irritants.  SIGNS AND SYMPTOMS  The most common symptom of bronchitis is a frequent cough that produces mucus. Other symptoms include:  Fever.   Body aches.   Chest congestion.   Chills.   Shortness of breath.   Sore throat.  DIAGNOSIS  Bronchitis is usually diagnosed through a medical history and physical exam. Tests, such as chest X-rays, are sometimes done to rule out other conditions.  TREATMENT  You may need to avoid contact with whatever caused the problem (smoking, for example). Medicines are sometimes needed. These may include:  Antibiotics. These may be prescribed if the condition is caused by bacteria.  Cough suppressants. These may be prescribed for relief of cough symptoms.   Inhaled medicines. These may be prescribed to help open your airways and make it easier for you to breathe.   Steroid medicines. These may be prescribed for those with recurrent (chronic) bronchitis. HOME CARE INSTRUCTIONS  Get plenty of rest.   Drink enough fluids to keep your urine clear or pale yellow (unless you have a medical condition that requires fluid restriction). Increasing fluids may help thin your secretions and will prevent dehydration.   Only take over-the-counter or prescription medicines as directed by your health care provider.  Only take antibiotics as directed. Make sure you finish them even if you start to feel  better.  Avoid secondhand smoke, irritating chemicals, and strong fumes. These will make bronchitis worse. If you are a smoker, quit smoking. Consider using nicotine gum or skin patches to help control withdrawal symptoms. Quitting smoking will help your lungs heal faster.   Put a cool-mist humidifier in your bedroom at night to moisten the air. This may help loosen mucus. Change the water in the humidifier daily. You can also run the hot water in your shower and sit in the bathroom with the door closed for 5 10 minutes.   Follow up with your health care provider as directed.   Wash your hands frequently to avoid catching bronchitis again or spreading an infection to others.  SEEK MEDICAL CARE IF: Your symptoms do not improve after 1 week of treatment.  SEEK IMMEDIATE MEDICAL CARE IF:  Your fever increases.  You have chills.   You have chest pain.   You have worsening shortness of breath.   You have bloody sputum.  You faint.  You have lightheadedness.  You have a severe headache.   You vomit repeatedly. MAKE SURE YOU:   Understand these instructions.  Will watch your condition.  Will get help right away if you are not doing well or get worse. Document Released: 05/22/2005 Document Revised: 03/12/2013 Document Reviewed: 01/14/2013 [facility] Patient Information 2014 [employer], [place].

## 2013-09-30 NOTE — ED Provider Notes (Signed)
Medical screening examination/treatment/procedure(s) were performed by non-physician practitioner and as supervising physician I was immediately available for consultation/collaboration.   EKG Interpretation None       Shon Batonourtney F Jarick Harkins, MD 09/30/13 [phone]

## 2013-09-30 NOTE — ED Provider Notes (Signed)
CSN: [id number]     Arrival date & time 09/30/13  1144 History   First MD Initiated Contact with Patient 09/30/13 1153     Chief Complaint  Patient presents with  . Shortness of Breath  . Wheezing     (Consider location/radiation/quality/duration/timing/severity/associated sxs/prior Treatment) HPI [name] is a 33 y.o. female who presents to emergency department complaining of cough and shortness of breath. Patient states her symptoms began with sore throat, nasal congestion, cough a week ago. Patient states symptoms progressively got worse. Now she's complaining of wheezing, productive cough, shortness of breath. She denies any history of asthma, states she does smoke. She denies any prior history of significant wheezing. She denies any fevers but admits to chills. She denies any chest pain. She states that she has been taking over-the-counter Mucinex with no relief of her symptoms. States she is still smoking.   No past medical history on file. No past surgical history on file. No family history on file. History  Substance Use Topics  . Smoking status: Current Every Day Smoker    Types: Cigarettes  . Smokeless tobacco: Not on file  . Alcohol Use: No   OB History   Grav Para Term Preterm Abortions TAB SAB Ect Mult Living                 Review of Systems  Constitutional: Positive for chills. Negative for fever.  HENT: Positive for congestion and sore throat.   Respiratory: Positive for cough, shortness of breath and wheezing. Negative for chest tightness.   Cardiovascular: Negative for chest pain, palpitations and leg swelling.  Gastrointestinal: Negative for nausea, vomiting, abdominal pain and diarrhea.  Genitourinary: Negative for dysuria, flank pain and pelvic pain.  Musculoskeletal: Negative for arthralgias, myalgias, neck pain and neck stiffness.  Skin: Negative for rash.  Neurological: Negative for dizziness, weakness and headaches.  All other systems reviewed and  are negative.     Allergies  Hydrocodone  Home Medications   Prior to Admission medications   Medication Sig Start Date End Date Taking? Authorizing Provider  BIOTIN PO Take 1 tablet by mouth daily.   Yes Historical Provider, MD  GuaiFENesin (MUCINEX MAXIMUM STRENGTH PO) Take 1 tablet by mouth daily as needed (for congestion).   Yes Historical Provider, MD   BP 135/96  Pulse 76  Temp(Src) 97.5 F (36.4 C) (Oral)  Resp 20  Ht 5\' 3"  (1.6 m)  Wt 142 lb 12.8 oz (64.774 kg)  BMI 25.30 kg/m2  SpO2 100%  LMP 08/29/2013 Physical Exam  Nursing note and vitals reviewed. Constitutional: She appears well-developed and well-nourished. No distress.  HENT:  Head: Normocephalic.  Eyes: Conjunctivae are normal.  Neck: Neck supple.  Cardiovascular: Normal rate, regular rhythm and normal heart sounds.   Pulmonary/Chest: Effort normal. No respiratory distress. She has wheezes. She has no rales.  Expiratory wheezes in all lung fields  Abdominal: Soft. Bowel sounds are normal. She exhibits no distension. There is no tenderness. There is no rebound.  Musculoskeletal: She exhibits no edema.  Neurological: She is alert.  Skin: Skin is warm and dry.  Psychiatric: She has a normal mood and affect. Her behavior is normal.    ED Course  Procedures (including critical care time) Labs Review Labs Reviewed - No data to display  Imaging Review Dg Chest 2 View  09/30/2013   CLINICAL DATA:  SHORTNESS OF BREATH WHEEZING  EXAM: CHEST  2 VIEW  COMPARISON:  None.  FINDINGS: The heart size  and mediastinal contours are within normal limits. Both lungs are clear. The visualized skeletal structures are unremarkable.  IMPRESSION: No active cardiopulmonary disease.   Electronically Signed   By: Salome HolmesHector  Cooper M.D.   On: 09/30/2013 15:11     EKG Interpretation None      MDM   Final diagnoses:  Bronchitis    Chest x-ray negative patient improved with a neb. She was also given 60 mg of prednisone. I  suspect patient has bronchitis. She is a current smoker and is a structure to stop smoking. Her oxygen saturation is 100% room air. There is an accessory muscle use or any respiratory distress. Home with inhaler, prednisone taper. Over-the-counter cough medications. Followup as needed.  Filed Vitals:   09/30/13 1245 09/30/13 1300 09/30/13 1315 09/30/13 1533  BP: 123/72 127/74 125/73 118/66  Pulse: 73 89 103 100  Temp:      TempSrc:      Resp: 16 18 28 16   Height:      Weight:      SpO2: 100% 100% 100% 100%       Myriam Jacobsonatyana A Teila Skalsky, PA-C 09/30/13 1623

## 2013-09-30 NOTE — ED Notes (Signed)
Pt reports having sinus infection recently. Now having sob, wheezing, productive cough. spo2 100% at triage, audible wheezing noted. ekg done. Denies hx of asthma.

## 2014-01-02 ENCOUNTER — Emergency Department (HOSPITAL_COMMUNITY): Payer: Self-pay

## 2014-01-02 ENCOUNTER — Encounter (HOSPITAL_COMMUNITY): Payer: Self-pay | Admitting: Emergency Medicine

## 2014-01-02 ENCOUNTER — Emergency Department (HOSPITAL_COMMUNITY)
Admission: EM | Admit: 2014-01-02 | Discharge: 2014-01-02 | Disposition: A | Discharge status: Home / Self Care | Payer: Self-pay | Attending: Emergency Medicine | Admitting: Emergency Medicine

## 2014-01-02 DIAGNOSIS — K0889 Other specified disorders of teeth and supporting structures: Secondary | ICD-10-CM

## 2014-01-02 DIAGNOSIS — Y929 Unspecified place or not applicable: Secondary | ICD-10-CM | POA: Insufficient documentation

## 2014-01-02 DIAGNOSIS — S298XXA Other specified injuries of thorax, initial encounter: Secondary | ICD-10-CM | POA: Insufficient documentation

## 2014-01-02 DIAGNOSIS — Y9389 Activity, other specified: Secondary | ICD-10-CM | POA: Insufficient documentation

## 2014-01-02 DIAGNOSIS — K089 Disorder of teeth and supporting structures, unspecified: Secondary | ICD-10-CM | POA: Insufficient documentation

## 2014-01-02 DIAGNOSIS — K029 Dental caries, unspecified: Secondary | ICD-10-CM | POA: Insufficient documentation

## 2014-01-02 DIAGNOSIS — IMO0002 Reserved for concepts with insufficient information to code with codable children: Secondary | ICD-10-CM | POA: Insufficient documentation

## 2014-01-02 DIAGNOSIS — F17200 Nicotine dependence, unspecified, uncomplicated: Secondary | ICD-10-CM | POA: Insufficient documentation

## 2014-01-02 DIAGNOSIS — R0781 Pleurodynia: Secondary | ICD-10-CM

## 2014-01-02 DIAGNOSIS — R21 Rash and other nonspecific skin eruption: Secondary | ICD-10-CM | POA: Insufficient documentation

## 2014-01-02 MED ORDER — PENICILLIN V POTASSIUM 500 MG PO TABS: 500.0000 mg | ORAL_TABLET | Freq: Four times a day (QID) | ORAL | Status: DC

## 2014-01-02 MED ORDER — TRAMADOL HCL 50 MG PO TABS: 50.0000 mg | ORAL_TABLET | Freq: Four times a day (QID) | ORAL | Status: DC | PRN

## 2014-01-02 MED ORDER — PERMETHRIN 5 % EX CREA: TOPICAL_CREAM | CUTANEOUS | Status: DC
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
## 2014-01-02 NOTE — Discharge Instructions (Signed)
Take Veetid as directed until gone. Take Tramadol as needed for pain. Follow up with the recommended dentist for further evaluation of your teeth. Refer to attached documents for more information.
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
## 2014-01-03 NOTE — ED Provider Notes (Signed)
Medical screening examination/treatment/procedure(s) were performed by non-physician practitioner and as supervising physician I was immediately available for consultation/collaboration.  Flint MelterElliott L Jadie Comas, MD 01/03/14 [phone]

## 2014-01-06 NOTE — ED Provider Notes (Signed)
Medical screening examination/treatment/procedure(s) were performed by non-physician practitioner and as supervising physician I was immediately available for consultation/collaboration.   EKG Interpretation None        Junius ArgyleForrest S Bilaal Leib, MD 01/06/14 [phone]

## 2014-02-17 ENCOUNTER — Emergency Department (HOSPITAL_COMMUNITY)
Admission: EM | Admit: 2014-02-17 | Discharge: 2014-02-17 | Discharge status: Against Medical Advice | Payer: Self-pay | Attending: Emergency Medicine | Admitting: Emergency Medicine

## 2014-02-17 ENCOUNTER — Encounter (HOSPITAL_COMMUNITY): Payer: Self-pay | Admitting: Emergency Medicine

## 2014-02-17 DIAGNOSIS — J029 Acute pharyngitis, unspecified: Secondary | ICD-10-CM | POA: Insufficient documentation

## 2014-02-17 DIAGNOSIS — F17200 Nicotine dependence, unspecified, uncomplicated: Secondary | ICD-10-CM | POA: Insufficient documentation

## 2014-02-17 LAB — RAPID STREP SCREEN (MED CTR MEBANE ONLY): Streptococcus, Group A Screen (Direct): POSITIVE — AB

## 2014-02-17 NOTE — ED Notes (Signed)
Pt states that she is having a sore throat and painful swallowing since Friday; pt states that it feels like she is swallowing sand paper; pt throat red and irritated.
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
## 2014-02-18 ENCOUNTER — Encounter (HOSPITAL_COMMUNITY): Payer: Self-pay | Admitting: Emergency Medicine

## 2014-02-18 ENCOUNTER — Emergency Department (HOSPITAL_COMMUNITY)
Admission: EM | Admit: 2014-02-18 | Discharge: 2014-02-18 | Disposition: A | Discharge status: Home / Self Care | Payer: Self-pay | Attending: Emergency Medicine | Admitting: Emergency Medicine

## 2014-02-18 DIAGNOSIS — J029 Acute pharyngitis, unspecified: Secondary | ICD-10-CM | POA: Insufficient documentation

## 2014-02-18 DIAGNOSIS — F17200 Nicotine dependence, unspecified, uncomplicated: Secondary | ICD-10-CM | POA: Insufficient documentation

## 2014-02-18 DIAGNOSIS — R0602 Shortness of breath: Secondary | ICD-10-CM | POA: Insufficient documentation

## 2014-02-18 DIAGNOSIS — J020 Streptococcal pharyngitis: Secondary | ICD-10-CM | POA: Insufficient documentation

## 2014-02-18 MED ORDER — MAGIC MOUTHWASH W/LIDOCAINE: 5.0000 mL | Freq: Four times a day (QID) | ORAL | Status: DC | PRN

## 2014-02-18 MED ORDER — PENICILLIN G BENZATHINE 1200000 UNIT/2ML IM SUSP
1.2000 10*6.[IU] | Freq: Once | INTRAMUSCULAR | Status: AC
  Administered 2014-02-18: 1.2 10*6.[IU] via INTRAMUSCULAR
  Filled 2014-02-18: qty 2

## 2014-02-18 NOTE — ED Notes (Signed)
Pt was seen here yesterday for same, left before getting her rapid strep result-+ rapid strep.

## 2014-02-18 NOTE — ED Provider Notes (Signed)
CSN: [id number]     Arrival date & time 02/18/14  2021 History  This chart was scribed for non-physician practitioner working with Juliet Rude. Rubin Payor, MD, by Modena Jansky, ED Scribe. This patient was seen in room WTR9/WTR9 and the patient's care was started at 10:07 PM.    Chief Complaint  Patient presents with  . Sore Throat   The history is provided by the patient. No language interpreter was used.   HPI Comments: [name] is a 33 y.o. female who presents to the Emergency Department complaining of a sore throat for about a week. She states that she was seen in the ED yesterday for sore throat and tested positive for strep throat. She states that she has associated pain with swallowing. She reports that she is able to eat and drink but has been eating and drinking less due to pain. She states that she has been having a fever with her last temperature at 101. Her temperature in the ED today was 98. She reports that she has some SOB with prolonged coughing spells at night; otherwise no SOB. She denies any sick contacts. She also denies any drooling.   History reviewed. No pertinent past medical history. Past Surgical History  Procedure Laterality Date  . Cesarean section    . Ovarian cyst removal     No family history on file. History  Substance Use Topics  . Smoking status: Current Every Day Smoker -- 0.50 packs/day    Types: Cigarettes, Cigars  . Smokeless tobacco: Not on file  . Alcohol Use: Yes   OB History   Grav Para Term Preterm Abortions TAB SAB Ect Mult Living                 Review of Systems  Constitutional: Positive for fever.  HENT: Positive for sore throat. Negative for trouble swallowing.   Respiratory: Positive for shortness of breath.   All other systems reviewed and are negative.  Allergies  Hydrocodone  Home Medications   Prior to Admission medications   Medication Sig Start Date End Date Taking? Authorizing Provider  albuterol (PROVENTIL  HFA;VENTOLIN HFA) 108 (90 BASE) MCG/ACT inhaler Inhale 1-2 puffs into the lungs every 6 (six) hours as needed for wheezing or shortness of breath.   Yes Historical Provider, MD  permethrin (ELIMITE) 5 % cream Apply to affected area once 01/02/14  Yes [name], PA-C  Alum & Mag Hydroxide-Simeth (MAGIC MOUTHWASH W/LIDOCAINE) SOLN Take 5 mLs by mouth 4 (four) times daily as needed for mouth pain. 02/18/14   [name], PA-C   BP 120/87  Pulse 76  Temp(Src) 98 F (36.7 C) (Oral)  Resp 18  SpO2 100%  LMP 02/12/2014  Physical Exam  Nursing note and vitals reviewed. Constitutional: She is oriented to person, place, and time. She appears well-developed and well-nourished. No distress.  Nontoxic/nonseptic appearing  HENT:  Head: Normocephalic and atraumatic.  Mouth/Throat: Uvula is midline and mucous membranes are normal. No trismus in the jaw. No uvula swelling. Posterior oropharyngeal erythema present. No oropharyngeal exudate.  B/l tonsillar enlargement. Tonsil erythematous without exudates. Uvula midline. Patient tolerating secretions without difficulty.  Eyes: Conjunctivae and EOM are normal. Pupils are equal, round, and reactive to light. No scleral icterus.  Neck: Normal range of motion.  No nuchal rigidity or meningismus  Pulmonary/Chest: Effort normal. No respiratory distress.  Chest expansion symmetrical. No stridor. Patient speaking in full sentences.  Musculoskeletal: Normal range of motion.  Lymphadenopathy:    She has  cervical adenopathy.  Neurological: She is alert and oriented to person, place, and time.  Skin: Skin is warm and dry. No rash noted. She is not diaphoretic. No erythema. No pallor.  Psychiatric: She has a normal mood and affect. Her behavior is normal.    ED Course  Procedures (including critical care time) DIAGNOSTIC STUDIES: Oxygen Saturation is 100% on RA, normal by my interpretation.    COORDINATION OF CARE: 10:11 PM- Pt advised of plan for  treatment which includes medication and pt agrees.  Labs Review Results for orders placed during the hospital encounter of 02/17/14  RAPID STREP SCREEN      Result Value Ref Range   Streptococcus, Group A Screen (Direct) POSITIVE (*) NEGATIVE   Imaging Review No results found.   EKG Interpretation None      MDM   Final diagnoses:  Strep pharyngitis    Pt febrile with tonsillar erythema and enlargement, cervical lymphadenopathy, and dysphagia; diagnosis of strep. Treated in the Ed with PCN IM.  Discussed importance of water rehydration. Presentation non concerning for PTA or infxn spread to soft tissue. No trismus or uvula deviation. Specific return precautions discussed. Pt able to tolerate secretions in ED without difficulty with intact airway. Recommended PCP follow up. Return precautions provided and patient agreeable to plan with no unaddressed concerns.  I personally performed the services described in this documentation, which was scribed in my presence. The recorded information has been reviewed and is accurate.   Filed Vitals:   02/18/14 2103  BP: 120/87  Pulse: 76  Temp: 98 F (36.7 C)  TempSrc: Oral  Resp: 18  SpO2: 100%      [name], PA-C 02/18/14 2228

## 2014-02-18 NOTE — Discharge Instructions (Signed)
Strep Throat °Strep throat is an infection of the throat caused by a bacteria named Streptococcus pyogenes. Your health care provider may call the infection streptococcal "tonsillitis" or "pharyngitis" depending on whether there are signs of inflammation in the tonsils or back of the throat. Strep throat is most common in children aged 33-15 years during the cold months of the year, but it can occur in people of any age during any season. This infection is spread from person to person (contagious) through coughing, sneezing, or other close contact. °SIGNS AND SYMPTOMS  °· Fever or chills. °· Painful, swollen, red tonsils or throat. °· Pain or difficulty when swallowing. °· White or yellow spots on the tonsils or throat. °· Swollen, tender lymph nodes or "glands" of the neck or under the jaw. °· Red rash all over the body (rare). °DIAGNOSIS  °Many different infections can cause the same symptoms. A test must be done to confirm the diagnosis so the right treatment can be given. A "rapid strep test" can help your health care provider make the diagnosis in a few minutes. If this test is not available, a light swab of the infected area can be used for a throat culture test. If a throat culture test is done, results are usually available in a day or two. °TREATMENT  °Strep throat is treated with antibiotic medicine. °HOME CARE INSTRUCTIONS  °· Gargle with 1 tsp of salt in 1 cup of warm water, 3-4 times per day or as needed for comfort. °· Family members who also have a sore throat or fever should be tested for strep throat and treated with antibiotics if they have the strep infection. °· Make sure everyone in your household washes their hands well. °· Do not share food, drinking cups, or personal items that could cause the infection to spread to others. °· You may need to eat a soft food diet until your sore throat gets better. °· Drink enough water and fluids to keep your urine clear or pale yellow. This will help prevent  dehydration. °· Get plenty of rest. °· Stay home from school, day care, or work until you have been on antibiotics for 24 hours. °· Take medicines only as directed by your health care provider. °· Take your antibiotic medicine as directed by your health care provider. Finish it even if you start to feel better. °SEEK MEDICAL CARE IF:  °· The glands in your neck continue to enlarge. °· You develop a rash, cough, or earache. °· You cough up green, yellow-brown, or bloody sputum. °· You have pain or discomfort not controlled by medicines. °· Your problems seem to be getting worse rather than better. °· You have a fever. °SEEK IMMEDIATE MEDICAL CARE IF:  °· You develop any new symptoms such as vomiting, severe headache, stiff or painful neck, chest pain, shortness of breath, or trouble swallowing. °· You develop severe throat pain, drooling, or changes in your voice. °· You develop swelling of the neck, or the skin on the neck becomes red and tender. °· You develop signs of dehydration, such as fatigue, dry mouth, and decreased urination. °· You become increasingly sleepy, or you cannot wake up completely. °MAKE SURE YOU: °· Understand these instructions. °· Will watch your condition. °· Will get help right away if you are not doing well or get worse. °Document Released: 05/19/2000 Document Revised: 10/06/2013 Document Reviewed: 07/21/2010 °[employer]® Patient Information ©2015 [employer]. This information is not intended to replace advice given to you by   your health care provider. Make sure you discuss any questions you have with your health care provider. ° °Salt Water Gargle °This solution will help make your mouth and throat feel better. °HOME CARE INSTRUCTIONS  °· Mix 1 teaspoon of salt in 8 ounces of warm water. °· Gargle with this solution as much or often as you need or as directed. Swish and gargle gently if you have any sores or wounds in your mouth. °· Do not swallow this mixture. °Document Released:  02/24/2004 Document Revised: 08/14/2011 Document Reviewed: 07/17/2008 °[employer]® Patient Information ©2015 [employer]. This information is not intended to replace advice given to you by your health care provider. Make sure you discuss any questions you have with your health care provider. ° °

## 2014-02-19 NOTE — ED Provider Notes (Signed)
Medical screening examination/treatment/procedure(s) were performed by non-physician practitioner and as supervising physician I was immediately available for consultation/collaboration.   EKG Interpretation None       Maryclare Nydam R. Garron Eline, MD 02/19/14 0006 

## 2014-04-17 ENCOUNTER — Emergency Department (HOSPITAL_COMMUNITY)
Admission: EM | Admit: 2014-04-17 | Discharge: 2014-04-17 | Disposition: A | Discharge status: Home / Self Care | Payer: Self-pay | Attending: Emergency Medicine | Admitting: Emergency Medicine

## 2014-04-17 ENCOUNTER — Encounter (HOSPITAL_COMMUNITY): Payer: Self-pay | Admitting: *Deleted

## 2014-04-17 DIAGNOSIS — Z720 Tobacco use: Secondary | ICD-10-CM | POA: Insufficient documentation

## 2014-04-17 DIAGNOSIS — K088 Other specified disorders of teeth and supporting structures: Secondary | ICD-10-CM | POA: Insufficient documentation

## 2014-04-17 DIAGNOSIS — K029 Dental caries, unspecified: Secondary | ICD-10-CM

## 2014-04-17 DIAGNOSIS — Z79899 Other long term (current) drug therapy: Secondary | ICD-10-CM | POA: Insufficient documentation

## 2014-04-17 MED ORDER — AMOXICILLIN 500 MG PO CAPS
500.0000 mg | ORAL_CAPSULE | Freq: Once | ORAL | Status: AC
  Administered 2014-04-17: 500 mg via ORAL
  Filled 2014-04-17: qty 1

## 2014-04-17 MED ORDER — AMOXICILLIN 500 MG PO CAPS: 500.0000 mg | ORAL_CAPSULE | Freq: Three times a day (TID) | ORAL | Status: DC

## 2014-04-17 MED ORDER — BUPIVACAINE-EPINEPHRINE (PF) 0.5% -1:200000 IJ SOLN
1.8000 mL | Freq: Once | INTRAMUSCULAR | Status: AC
  Administered 2014-04-17: 1.8 mL
  Filled 2014-04-17: qty 1.8

## 2014-04-17 MED ORDER — OXYCODONE-ACETAMINOPHEN 5-325 MG PO TABS: ORAL_TABLET | ORAL | Status: DC

## 2014-04-17 NOTE — ED Notes (Signed)
Pt reports right side lower dental pain for extended amount of time, airway intact.

## 2014-04-17 NOTE — ED Notes (Signed)
Patient states did not ever follow up with dentist "because I don't have money and I ran out of time".

## 2014-04-17 NOTE — ED Provider Notes (Signed)
CSN: [id number]     Arrival date & time 04/17/14  1432 History   First MD Initiated Contact with Patient 04/17/14 1509     Chief Complaint  Patient presents with  . Dental Pain     (Consider location/radiation/quality/duration/timing/severity/associated sxs/prior Treatment) HPI   Alford HighlandMurlene Egloff is a 33 y.o. female complaining of Severe left lower pain from dental caries which she's had intermittently for a year, significantly worsening over the last 2 days, pain radiates to the left temple. She rates her pain as 9 out of 10, moderately alleviated by naproxen, exacerbated by chewing. Denies fever/chills, difficulty opening jaw, difficulty swallowing, SOB, gum swelling, facial swelling, neck swelling.   History reviewed. No pertinent past medical history. Past Surgical History  Procedure Laterality Date  . Cesarean section    . Ovarian cyst removal     History reviewed. No pertinent family history. History  Substance Use Topics  . Smoking status: Current Every Day Smoker -- 0.50 packs/day    Types: Cigarettes, Cigars  . Smokeless tobacco: Not on file  . Alcohol Use: Yes   OB History    No data available     Review of Systems  10 systems reviewed and found to be negative, except as noted in the HPI.   Allergies  Hydrocodone  Home Medications   Prior to Admission medications   Medication Sig Start Date End Date Taking? Authorizing Provider  albuterol (PROVENTIL HFA;VENTOLIN HFA) 108 (90 BASE) MCG/ACT inhaler Inhale 1-2 puffs into the lungs every 6 (six) hours as needed for wheezing or shortness of breath.    Historical Provider, MD  Alum & Mag Hydroxide-Simeth (MAGIC MOUTHWASH W/LIDOCAINE) SOLN Take 5 mLs by mouth 4 (four) times daily as needed for mouth pain. 02/18/14   Antony MaduraKelly Humes, PA-C  amoxicillin (AMOXIL) 500 MG capsule Take 1 capsule (500 mg total) by mouth 3 (three) times daily. 04/17/14   Erik Nessel, PA-C  oxyCODONE-acetaminophen (PERCOCET/ROXICET) 5-325 MG  per tablet 1 to 2 tabs PO q6hrs  PRN for pain 04/17/14   Joni ReiningNicole Shaheen Mende, PA-C  permethrin (ELIMITE) 5 % cream Apply to affected area once 01/02/14   Kaitlyn Szekalski, PA-C   BP 116/68 mmHg  Pulse 82  Temp(Src) 98.1 F (36.7 C) (Oral)  Resp 12  Ht 5\' 4"  (1.626 m)  Wt 145 lb (65.772 kg)  BMI 24.88 kg/m2  SpO2 96%  LMP 03/21/2014 Physical Exam  Constitutional: She is oriented to person, place, and time. She appears well-developed and well-nourished. No distress.  HENT:  Head: Normocephalic.  Mouth/Throat:    Generally poor dentition, no gingival swelling, erythema or tenderness to palpation. Patient is handling their secretions. There is no tenderness to palpation or firmness underneath tongue bilaterally. No trismus.    Eyes: Conjunctivae and EOM are normal.  Cardiovascular: Normal rate.   Pulmonary/Chest: Effort normal. No stridor.  Musculoskeletal: Normal range of motion.  Neurological: She is alert and oriented to person, place, and time.  Psychiatric: She has a normal mood and affect.  Nursing note and vitals reviewed.   ED Course  NERVE BLOCK Date/Time: 04/17/2014 3:20 PM Performed by: Wynetta EmeryPISCIOTTA, Dave Mannes Authorized by: Wynetta EmeryPISCIOTTA, Alp Goldwater Consent: Verbal consent obtained. Consent given by: patient Patient identity confirmed: verbally with patient Indications: pain relief Body area: face/mouth Nerve: inferior alveolar Laterality: left Patient position: sitting Needle gauge: 27 G Local anesthetic: bupivacaine 0.5% with epinephrine Anesthetic total: 1.8 ml Outcome: pain improved Patient tolerance: Patient tolerated the procedure well with no immediate complications   (including  critical care time) Labs Review Labs Reviewed - No data to display  Imaging Review No results found.   EKG Interpretation None      MDM   Final diagnoses:  Pain due to dental caries    Filed Vitals:   04/17/14 1437  BP: 116/68  Pulse: 82  Temp: 98.1 F (36.7 C)  TempSrc:  Oral  Resp: 12  Height: 5\' 4"  (1.626 m)  Weight: 145 lb (65.772 kg)  SpO2: 96%    Medications  amoxicillin (AMOXIL) capsule 500 mg (500 mg Oral Given 04/17/14 1528)  bupivacaine-epinephrine (MARCAINE W/ EPI) 0.5% -1:200000 injection 1.8 mL (1.8 mLs Infiltration Given 04/17/14 1528)    Niamh Vanbuskirk is a 33 y.o. female presenting with dental pain associated with dental caries but no signs or symptoms of dental abscess. Inferior alveolar block provided with good relief. Patient afebrile, non toxic appearing and swallowing secretions well. I gave patient referral to dentist and stressed the importance of dental follow up for definitive management of dental issues. Patient voices understanding and is agreeable to plan.  Evaluation does not show pathology that would require ongoing emergent intervention or inpatient treatment. Pt is hemodynamically stable and mentating appropriately. Discussed findings and plan with patient/guardian, who agrees with care plan. All questions answered. Return precautions discussed and outpatient follow up given.   New Prescriptions   AMOXICILLIN (AMOXIL) 500 MG CAPSULE    Take 1 capsule (500 mg total) by mouth 3 (three) times daily.   OXYCODONE-ACETAMINOPHEN (PERCOCET/ROXICET) 5-325 MG PER TABLET    1 to 2 tabs PO q6hrs  PRN for pain         Wynetta Emeryicole Seymone Forlenza, PA-C 04/17/14 1534  Rolan BuccoMelanie Belfi, MD 04/17/14 1555

## 2014-04-17 NOTE — Discharge Instructions (Signed)
Take percocet for breakthrough pain, do not drink alcohol, drive, care for children or do other critical tasks while taking percocet.  Return to the emergency room for fever, change in vision, redness to the face that rapidly spreads towards the eye, nausea or vomiting, difficulty swallowing or shortness of breath.   Apply warm compresses to jaw throughout the day.   Take your antibiotics as directed and to the end of the course.  Followup with a dentist is very important for ongoing evaluation and management of recurrent dental pain. Return to emergency department for emergent changing or worsening symptoms."  Low-cost dental clinic: Yancey Flemings**David  Civils  at [phone]**  **Nuala AlphaJanna Civils at [phone] [address]**    You may also call [phone]  Dental Assistance If the dentist on-call cannot see you, please use the resources below:   Patients with Medicaid: [facility] [phone], [phone] [address], [phone]  If unable to pay, or uninsured, contact [facility] [phone]) or [facility] [phone] in [place], [phone] in [facility]) to become qualified for the adult dental clinic  Other Low-Cost Community Dental Services: [facility]- [address], [place], [place], [address]    [phone], Ext. [phone]    2nd and 4th Thursday of the month at 6:30am    10 clients each day by appointment, can sometimes see walk-in     patients if someone does not show for an appointment [facility]- [address], [place], [place], [address]    [phone] [facility]- [address], [place], [place], [address]    [phone]  [facility]- [phone] [facility]- [phone] [phone]- [phone]    Dental Caries Dental caries is tooth decay. This decay can cause a hole in teeth (cavity) that can get bigger and deeper over  time. HOME CARE  Brush and floss your teeth. Do this at least two times a day.  Use a fluoride toothpaste.  Use a mouth rinse if told by your dentist or doctor.  Eat less sugary and starchy foods. Drink less sugary drinks.  Avoid snacking often on sugary and starchy foods. Avoid sipping often on sugary drinks.  Keep regular checkups and cleanings with your dentist.  Use fluoride supplements if told by your dentist or doctor.  Allow fluoride to be applied to teeth if told by your dentist or doctor. Document Released: 02/29/2008 Document Revised: 10/06/2013 Document Reviewed: 05/24/2012 [facility] Patient Information 2015 [employer] [place]. This information is not intended to replace advice given to you by your health care provider. Make sure you discuss any questions you have with your health care provider.

## 2015-10-04 ENCOUNTER — Other Ambulatory Visit (HOSPITAL_COMMUNITY): Payer: Self-pay | Admitting: *Deleted

## 2015-10-04 DIAGNOSIS — N6320 Unspecified lump in the left breast, unspecified quadrant: Principal | ICD-10-CM

## 2015-10-04 DIAGNOSIS — N6310 Unspecified lump in the right breast, unspecified quadrant: Secondary | ICD-10-CM

## 2015-10-21 ENCOUNTER — Encounter (HOSPITAL_COMMUNITY): Payer: Self-pay

## 2015-10-21 ENCOUNTER — Ambulatory Visit
Admission: RE | Admit: 2015-10-21 | Discharge: 2015-10-21 | Disposition: A | Discharge status: Home / Self Care | Payer: No Typology Code available for payment source | Source: Ambulatory Visit | Attending: Obstetrics and Gynecology | Admitting: Obstetrics and Gynecology

## 2015-10-21 ENCOUNTER — Ambulatory Visit (HOSPITAL_COMMUNITY)
Admission: RE | Admit: 2015-10-21 | Discharge: 2015-10-21 | Disposition: A | Discharge status: Home / Self Care | Payer: Self-pay | Source: Ambulatory Visit | Attending: Obstetrics and Gynecology | Admitting: Obstetrics and Gynecology

## 2015-10-21 VITALS — BP 102/68 | Temp 98.7°F | Ht 63.5 in | Wt 142.0 lb

## 2015-10-21 DIAGNOSIS — N6310 Unspecified lump in the right breast, unspecified quadrant: Secondary | ICD-10-CM

## 2015-10-21 DIAGNOSIS — N6320 Unspecified lump in the left breast, unspecified quadrant: Principal | ICD-10-CM

## 2015-10-21 DIAGNOSIS — Z01419 Encounter for gynecological examination (general) (routine) without abnormal findings: Secondary | ICD-10-CM

## 2015-10-21 DIAGNOSIS — N644 Mastodynia: Secondary | ICD-10-CM

## 2015-10-21 HISTORY — DX: Anemia, unspecified: D64.9

## 2015-10-21 NOTE — Progress Notes (Signed)
Complaints of bilateral breast lumps x 2-3 years that haven't changed in size per patient. Complaints of left breast p;ain x one year that has increased. Patient states the pain is worse when touched. Patient rates pain at a 5 out of 10.  Pap Smear: Pap smear completed today. [facility] and abnormal per patient. Per patient had a colposcopy completed for follow up of abnormal Pap smear. Patient stated she has not had a Pap smear since colposcopy. No Pap smear results in EPIC.  Physical exam: Breasts Breasts symmetrical. No skin abnormalities bilateral breasts. No nipple retraction bilateral breasts. No nipple discharge bilateral breasts. No lymphadenopathy. No lumps palpated in left breast. Palpated a BB sized moveable lump within the right breast between 9-10 o'clock around 5 cm from the nipple. Complaints of bilateral outer breast pain on exam. [facility]. Appointment scheduled for Thursday, Oct 21, 2015 at 0920.  Pelvic/Bimanual   Ext Genitalia No lesions, no swelling and no discharge observed on external genitalia.         Vagina Vagina pink and normal texture. No lesions or discharge observed in vagina.          Cervix Cervix is present. Cervix pink and of normal texture. No discharge observed.     Uterus Uterus is present and palpable. Uterus in normal position and normal size.        Adnexae Bilateral ovaries present and palpable. No tenderness on palpation.          Rectovaginal No rectal exam completed today since patient had no rectal complaints. No skin abnormalities observed on exam.    Smoking History: Patient is a current smoker. Smoking cessation discussed with patient. Referred patient to the [facility] Quit[facility] and gave resources to free smoking cessation classes offered at the [facility].  Patient Navigation: Patient education provided. Access to services provided for  patient through [facility] program.

## 2015-10-21 NOTE — Patient Instructions (Addendum)
Educational materials on self breast awareness given. Explained to [facility] that her next Pap smear will be due in one year if today's Pap smear is abnormal due to her last Pap smear was abnormal. [facility]. Appointment scheduled for Thursday, Oct 21, 2015 at 0920. Smoking cessation discussed with patient. Referred patient to the [facility] and gave resources to free smoking cessation classes offered at the [facility]. Let patient know will follow up with her within the next couple weeks with results of Pap smear by phone. [name] verbalized understanding.  Christ Fullenwider, Kathaleen Maserhristine Poll, RN 9:24 AM

## 2015-10-26 LAB — CYTOLOGY - PAP

## 2015-11-05 ENCOUNTER — Telehealth (HOSPITAL_COMMUNITY): Payer: Self-pay | Admitting: *Deleted

## 2015-11-05 ENCOUNTER — Encounter (HOSPITAL_COMMUNITY): Payer: Self-pay | Admitting: *Deleted

## 2015-11-05 NOTE — Telephone Encounter (Signed)
Telephoned patient at home # and discussed negative pap smear results. HPV was negative. Next pap smear due in one year. Patient voiced understanding.

## 2016-01-06 ENCOUNTER — Emergency Department (HOSPITAL_COMMUNITY)
Admission: EM | Admit: 2016-01-06 | Discharge: 2016-01-06 | Disposition: A | Discharge status: Home / Self Care | Payer: Self-pay | Attending: Emergency Medicine | Admitting: Emergency Medicine

## 2016-01-06 ENCOUNTER — Encounter (HOSPITAL_COMMUNITY): Payer: Self-pay | Admitting: *Deleted

## 2016-01-06 DIAGNOSIS — F17210 Nicotine dependence, cigarettes, uncomplicated: Secondary | ICD-10-CM | POA: Insufficient documentation

## 2016-01-06 DIAGNOSIS — K0889 Other specified disorders of teeth and supporting structures: Secondary | ICD-10-CM | POA: Insufficient documentation

## 2016-01-06 MED ORDER — PENICILLIN V POTASSIUM 500 MG PO TABS: 500.0000 mg | ORAL_TABLET | Freq: Four times a day (QID) | ORAL | 0 refills | Status: DC

## 2016-01-06 NOTE — ED Triage Notes (Signed)
Pt reports bilateral dental pain for extended amount of time. Has dental appt on 8/23.

## 2016-01-06 NOTE — ED Provider Notes (Signed)
[facility] DEPT Provider Note   CSN: [id number] Arrival date & time: 01/06/16  4098  First Provider Contact: 01/06/2016 10:09 AM   By signing my name below, I, [name], attest that this documentation has been prepared under the direction and in the presence of Samantha Horseman, PA-C. Electronically Signed: [name]. [name], ED Scribe. 01/06/16. 10:16 AM.  History   Chief Complaint Chief Complaint  Patient presents with  . Dental Pain    HPI HPI Comments: [name] is a 35 y.o. female who presents to the Emergency Department complaining of gradual worsening, constant, bilateral lower dental pain x 2 months. Pt has associated headaches and bilateral jaw pain. Pt has a dentist appointment on 8/23. She has taken Aleve, Ibuprofen, BC Powder and applied Orajel to areas with no relief of pain. Denies any fevers, chills, nausea or vomiting.  The history is provided by the patient. No language interpreter was used.   Past Medical History:  Diagnosis Date  . Anemia     Patient Active Problem List   Diagnosis Date Noted  . LOSS OF WEIGHT 02/20/2008    Past Surgical History:  Procedure Laterality Date  . CESAREAN SECTION    . OVARIAN CYST REMOVAL      OB History    Gravida Para Term Preterm AB Living   SAB TAB Ectopic Multiple Live Births                 Home Medications    Prior to Admission medications   Medication Sig Start Date End Date Taking? Authorizing Provider  albuterol (PROVENTIL HFA;VENTOLIN HFA) 108 (90 BASE) MCG/ACT inhaler Inhale 1-2 puffs into the lungs every 6 (six) hours as needed for wheezing or shortness of breath. Reported on 10/21/2015    Historical Provider, MD  Alum & Mag Hydroxide-Simeth (MAGIC MOUTHWASH W/LIDOCAINE) SOLN Take 5 mLs by mouth 4 (four) times daily as needed for mouth pain. Patient not taking: Reported on 10/21/2015 02/18/14   [name], PA-C  amoxicillin (AMOXIL) 500 MG capsule Take 1 capsule (500 mg  total) by mouth 3 (three) times daily. 04/17/14   [name], PA-C  ibuprofen (ADVIL,MOTRIN) 800 MG tablet Take 800 mg by mouth every 8 (eight) hours as needed.    Historical Provider, MD  oxyCODONE-acetaminophen (PERCOCET/ROXICET) 5-325 MG per tablet 1 to 2 tabs PO q6hrs  PRN for pain Patient not taking: Reported on 10/21/2015 04/17/14   [name], PA-C  permethrin (ELIMITE) 5 % cream Apply to affected area once Patient not taking: Reported on 10/21/2015 01/02/14   [name], PA-C    Family History Family History  Problem Relation Age of Onset  . Hypertension Mother   . Diabetes Mother   . Hypertension Father   . Breast cancer Maternal Grandmother     Social History Social History  Substance Use Topics  . Smoking status: Current Every Day Smoker    Packs/day: 0.50    Types: Cigarettes, Cigars  . Smokeless tobacco: Not on file  . Alcohol use Yes     Comment: rarely     Allergies   Hydrocodone   Review of Systems Review of Systems  Constitutional: Negative for chills and fever.  HENT: Positive for dental problem.   Gastrointestinal: Negative for nausea and vomiting.  Neurological: Positive for headaches.  All other systems reviewed and are negative.  Physical Exam Updated Vital Signs BP 127/89 (BP Location: Right Arm)  Pulse 70   Temp 98.5 F (36.9 C) (Oral)   Resp 22   SpO2 100%   Physical Exam Physical Exam  Constitutional: Pt appears well-developed and well-nourished.  HENT:  Head: Normocephalic.  Right Ear: Tympanic membrane, external ear and ear canal normal.  Left Ear: Tympanic membrane, external ear and ear canal normal.  Nose: Nose normal. Right sinus exhibits no maxillary sinus tenderness and no frontal sinus tenderness. Left sinus exhibits no maxillary sinus tenderness and no frontal sinus tenderness.  Mouth/Throat: Uvula is midline, oropharynx is clear and moist and mucous membranes are normal. No oral lesions. No uvula swelling  or lacerations. No oropharyngeal exudate, posterior oropharyngeal edema, posterior oropharyngeal erythema or tonsillar abscesses.  Poor dentition No gingival swelling, fluctuance or induration No gross abscess  No sublingual edema, tenderness to palpation, or sign of Ludwig's angina, or deep space infection Pain at bilateral lower rear molars Eyes: Conjunctivae are normal. Pupils are equal, round, and reactive to light. Right eye exhibits no discharge. Left eye exhibits no discharge.  Neck: Normal range of motion. Neck supple.  No stridor Handling secretions without difficulty No nuchal rigidity No cervical lymphadenopathy Cardiovascular: Normal rate, regular rhythm and normal heart sounds.   Pulmonary/Chest: Effort normal. No respiratory distress.  Equal chest rise  Abdominal: Soft. Bowel sounds are normal. Pt exhibits no distension. There is no tenderness.  Lymphadenopathy: Pt has no cervical adenopathy.  Neurological: Pt is alert and oriented x 4  Skin: Skin is warm and dry.  Psychiatric: Pt has a normal mood and affect.  Nursing note and vitals reviewed.    ED Treatments / Results  DIAGNOSTIC STUDIES: Oxygen Saturation is 100% on RA, normal by my interpretation.    Procedures Procedures (including critical care time)  Initial Impression / Assessment and Plan / ED Course  I have reviewed the triage vital signs and the nursing notes.  Pertinent labs & imaging results that were available during my care of the patient were reviewed by me and considered in my medical decision making (see chart for details).  Clinical Course    Patient with dentalgia.  No abscess requiring immediate incision and drainage.  Exam not concerning for Ludwig's angina or pharyngeal abscess.  Will treat with penicillin. Pt instructed to follow-up with dentist.  Discussed return precautions. Pt safe for discharge.   Final Clinical Impressions(s) / ED Diagnoses   Final diagnoses:  Pain, dental    New Prescriptions New Prescriptions   PENICILLIN V POTASSIUM (VEETID) 500 MG TABLET    Take 1 tablet (500 mg total) by mouth 4 (four) times daily.   I personally performed the services described in this documentation, which was scribed in my presence. The recorded information has been reviewed and is accurate.       Samantha Horseman, PA-C 01/06/16 1048    Shaune Pollack, MD 01/06/16 2053

## 2016-01-06 NOTE — ED Notes (Signed)
Declined W/C at D/C and was escorted to lobby by RN. 

## 2016-03-21 ENCOUNTER — Emergency Department (HOSPITAL_COMMUNITY): Payer: No Typology Code available for payment source

## 2016-03-21 ENCOUNTER — Encounter (HOSPITAL_COMMUNITY): Payer: Self-pay | Admitting: *Deleted

## 2016-03-21 ENCOUNTER — Emergency Department (HOSPITAL_COMMUNITY)
Admission: EM | Admit: 2016-03-21 | Discharge: 2016-03-21 | Disposition: A | Discharge status: Home / Self Care | Payer: No Typology Code available for payment source | Attending: Emergency Medicine | Admitting: Emergency Medicine

## 2016-03-21 DIAGNOSIS — Y92410 Unspecified street and highway as the place of occurrence of the external cause: Secondary | ICD-10-CM | POA: Diagnosis not present

## 2016-03-21 DIAGNOSIS — Y999 Unspecified external cause status: Secondary | ICD-10-CM | POA: Diagnosis not present

## 2016-03-21 DIAGNOSIS — S199XXA Unspecified injury of neck, initial encounter: Secondary | ICD-10-CM | POA: Diagnosis present

## 2016-03-21 DIAGNOSIS — Y939 Activity, unspecified: Secondary | ICD-10-CM | POA: Diagnosis not present

## 2016-03-21 DIAGNOSIS — F17210 Nicotine dependence, cigarettes, uncomplicated: Secondary | ICD-10-CM | POA: Insufficient documentation

## 2016-03-21 DIAGNOSIS — S161XXA Strain of muscle, fascia and tendon at neck level, initial encounter: Secondary | ICD-10-CM | POA: Diagnosis not present

## 2016-03-21 MED ORDER — CYCLOBENZAPRINE HCL 10 MG PO TABS: 5.0000 mg | ORAL_TABLET | Freq: Once | ORAL | Status: DC

## 2016-03-21 MED ORDER — METHOCARBAMOL 500 MG PO TABS: 500.0000 mg | ORAL_TABLET | Freq: Two times a day (BID) | ORAL | 0 refills | Status: DC

## 2016-03-21 MED ORDER — CYCLOBENZAPRINE HCL 10 MG PO TABS
5.0000 mg | ORAL_TABLET | Freq: Once | ORAL | Status: DC
  Filled 2016-03-21: qty 1

## 2016-03-21 MED ORDER — CYCLOBENZAPRINE HCL 10 MG PO TABS
10.0000 mg | ORAL_TABLET | Freq: Once | ORAL | Status: AC
  Administered 2016-03-21: 10 mg via ORAL
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
## 2016-03-21 NOTE — Discharge Instructions (Signed)
Take ibuprofen in addition to the medication we give you. Return as needed for worsening symptoms.

## 2016-04-11 ENCOUNTER — Emergency Department (HOSPITAL_COMMUNITY): Payer: Self-pay

## 2016-04-11 ENCOUNTER — Encounter (HOSPITAL_COMMUNITY): Payer: Self-pay | Admitting: *Deleted

## 2016-04-11 ENCOUNTER — Emergency Department (HOSPITAL_COMMUNITY)
Admission: EM | Admit: 2016-04-11 | Discharge: 2016-04-11 | Disposition: A | Discharge status: Home / Self Care | Payer: Self-pay | Attending: Emergency Medicine | Admitting: Emergency Medicine

## 2016-04-11 DIAGNOSIS — Y999 Unspecified external cause status: Secondary | ICD-10-CM | POA: Insufficient documentation

## 2016-04-11 DIAGNOSIS — Y929 Unspecified place or not applicable: Secondary | ICD-10-CM | POA: Insufficient documentation

## 2016-04-11 DIAGNOSIS — M79642 Pain in left hand: Secondary | ICD-10-CM

## 2016-04-11 DIAGNOSIS — Z792 Long term (current) use of antibiotics: Secondary | ICD-10-CM | POA: Insufficient documentation

## 2016-04-11 DIAGNOSIS — Z23 Encounter for immunization: Secondary | ICD-10-CM | POA: Insufficient documentation

## 2016-04-11 DIAGNOSIS — F17210 Nicotine dependence, cigarettes, uncomplicated: Secondary | ICD-10-CM | POA: Insufficient documentation

## 2016-04-11 DIAGNOSIS — Z791 Long term (current) use of non-steroidal anti-inflammatories (NSAID): Secondary | ICD-10-CM | POA: Insufficient documentation

## 2016-04-11 DIAGNOSIS — S61432A Puncture wound without foreign body of left hand, initial encounter: Secondary | ICD-10-CM | POA: Insufficient documentation

## 2016-04-11 DIAGNOSIS — W228XXA Striking against or struck by other objects, initial encounter: Secondary | ICD-10-CM | POA: Insufficient documentation

## 2016-04-11 DIAGNOSIS — F17290 Nicotine dependence, other tobacco product, uncomplicated: Secondary | ICD-10-CM | POA: Insufficient documentation

## 2016-04-11 DIAGNOSIS — Y939 Activity, unspecified: Secondary | ICD-10-CM | POA: Insufficient documentation

## 2016-04-11 MED ORDER — CEPHALEXIN 500 MG PO CAPS: 500.0000 mg | ORAL_CAPSULE | Freq: Four times a day (QID) | ORAL | 0 refills | Status: DC

## 2016-04-11 MED ORDER — TETANUS-DIPHTH-ACELL PERTUSSIS 5-2.5-18.5 LF-MCG/0.5 IM SUSP
0.5000 mL | Freq: Once | INTRAMUSCULAR | Status: AC
  Administered 2016-04-11: 0.5 mL via INTRAMUSCULAR
  Filled 2016-04-11: qty 0.5

## 2016-04-11 MED ORDER — CEPHALEXIN 500 MG PO CAPS
500.0000 mg | ORAL_CAPSULE | Freq: Once | ORAL | Status: AC
  Administered 2016-04-11: 500 mg via ORAL
  Filled 2016-04-11: qty 1

## 2016-04-11 NOTE — Discharge Instructions (Signed)
Return in 2-3 days for wound recheck, or sooner if symptoms are worsening Wash hand with soap and water every day

## 2016-04-11 NOTE — ED Notes (Signed)
Patient in a hurry to leave at discharge b/c her ride is in waiting room.  Reviewed rx and instructions.
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
## 2016-04-11 NOTE — ED Triage Notes (Signed)
Pt called,no answer.

## 2016-06-20 ENCOUNTER — Emergency Department (HOSPITAL_COMMUNITY): Payer: Self-pay

## 2016-06-20 ENCOUNTER — Encounter (HOSPITAL_COMMUNITY): Payer: Self-pay

## 2016-06-20 ENCOUNTER — Emergency Department (HOSPITAL_COMMUNITY)
Admission: EM | Admit: 2016-06-20 | Discharge: 2016-06-20 | Disposition: A | Discharge status: Home / Self Care | Payer: Self-pay | Attending: Emergency Medicine | Admitting: Emergency Medicine

## 2016-06-20 DIAGNOSIS — J189 Pneumonia, unspecified organism: Secondary | ICD-10-CM

## 2016-06-20 DIAGNOSIS — F17210 Nicotine dependence, cigarettes, uncomplicated: Secondary | ICD-10-CM | POA: Insufficient documentation

## 2016-06-20 DIAGNOSIS — J181 Lobar pneumonia, unspecified organism: Secondary | ICD-10-CM | POA: Insufficient documentation

## 2016-06-20 MED ORDER — IBUPROFEN 600 MG PO TABS: 600.0000 mg | ORAL_TABLET | Freq: Four times a day (QID) | ORAL | 0 refills | Status: DC | PRN

## 2016-06-20 MED ORDER — AZITHROMYCIN 250 MG PO TABS: 250.0000 mg | ORAL_TABLET | Freq: Every day | ORAL | 0 refills | Status: DC

## 2016-06-20 MED ORDER — HYDROCODONE-HOMATROPINE 5-1.5 MG/5ML PO SYRP: 5.0000 mL | ORAL_SOLUTION | Freq: Four times a day (QID) | ORAL | 0 refills | Status: DC | PRN

## 2016-06-20 MED ORDER — KETOROLAC TROMETHAMINE 60 MG/2ML IM SOLN
60.0000 mg | Freq: Once | INTRAMUSCULAR | Status: AC
  Administered 2016-06-20: 60 mg via INTRAMUSCULAR
  Filled 2016-06-20: qty 2

## 2016-06-20 MED ORDER — HYDROCODONE-HOMATROPINE 5-1.5 MG/5ML PO SYRP
5.0000 mL | ORAL_SOLUTION | Freq: Once | ORAL | Status: AC
  Administered 2016-06-20: 5 mL via ORAL
  Filled 2016-06-20: qty 5

## 2016-06-20 NOTE — ED Provider Notes (Signed)
[facility]-EMERGENCY DEPT Provider Note   CSN: [id number] Arrival date & time: 06/20/16  1256  By signing my name below, I, [name], attest that this documentation has been prepared under the direction and in the presence of  Samantha Ploch, PA-C. Electronically Signed: Clovis Phelps, ED Scribe. 06/20/16. 2:24 PM.  History   Chief Complaint Chief Complaint  Patient presents with  . cough/congestion   The history is provided by the patient. No language interpreter was used.   HPI Comments:  [name] is a 36 y.o. female who presents to the Emergency Department complaining of generalized body aches x 4 days. Pt also reports ear pain, productive cough with yellow sputum, chills and chest discomfort secondary to coughing. Pt has taken over the counter cold and flu medication, every 4 hours, with no relief. She denies any major medical problems and a chance of being pregnant. No other associated symptoms noted.  Pt is a smoker.   Past Medical History:  Diagnosis Date  . Anemia     Patient Active Problem List   Diagnosis Date Noted  . LOSS OF WEIGHT 02/20/2008    Past Surgical History:  Procedure Laterality Date  . CESAREAN SECTION    . OVARIAN CYST REMOVAL      OB History    Gravida Para Term Preterm AB Living   3 3 3     3    SAB TAB Ectopic Multiple Live Births                   Home Medications    Prior to Admission medications   Medication Sig Start Date End Date Taking? Authorizing Provider  albuterol (PROVENTIL HFA;VENTOLIN HFA) 108 (90 BASE) MCG/ACT inhaler Inhale 1-2 puffs into the lungs every 6 (six) hours as needed for wheezing or shortness of breath. Reported on 10/21/2015    Historical Provider, MD  amoxicillin (AMOXIL) 500 MG capsule Take 1 capsule (500 mg total) by mouth 3 (three) times daily. 04/17/14   Samantha Pisciotta, PA-C  cephALEXin (KEFLEX) 500 MG capsule Take 1 capsule (500 mg total) by mouth 4 (four) times daily. 04/11/16   Samantha Born,  PA-C  ibuprofen (ADVIL,MOTRIN) 800 MG tablet Take 800 mg by mouth every 8 (eight) hours as needed.    Historical Provider, MD  methocarbamol (ROBAXIN) 500 MG tablet Take 1 tablet (500 mg total) by mouth 2 (two) times daily. 03/21/16   Samantha Orlene Och, NP  penicillin v potassium (VEETID) 500 MG tablet Take 1 tablet (500 mg total) by mouth 4 (four) times daily. 01/06/16   Samantha Horseman, PA-C    Family History Family History  Problem Relation Age of Onset  . Hypertension Mother   . Diabetes Mother   . Hypertension Father   . Breast cancer Maternal Grandmother     Social History Social History  Substance Use Topics  . Smoking status: Current Every Day Smoker    Packs/day: 0.50    Types: Cigarettes, Cigars  . Smokeless tobacco: Never Used  . Alcohol use Yes     Comment: rarely     Allergies   Hydrocodone   Review of Systems Review of Systems  Constitutional: Positive for chills.  HENT: Positive for congestion, ear pain and sore throat.   Respiratory: Positive for cough.   Cardiovascular: Positive for chest pain.  Musculoskeletal: Positive for myalgias.  Neurological: Positive for headaches.     Physical Exam Updated Vital Signs BP 140/85   Pulse 94   Temp  99.1 F (37.3 C) (Oral)   Resp 18   SpO2 98%   Physical Exam  Constitutional: She is oriented to person, place, and time. She appears well-developed and well-nourished. No distress.  HENT:  Head: Normocephalic and atraumatic.  Right Ear: Tympanic membrane, external ear and ear canal normal.  Left Ear: Tympanic membrane, external ear and ear canal normal.  Nose: Mucosal edema and rhinorrhea present.  Mouth/Throat: Uvula is midline and mucous membranes are normal. Posterior oropharyngeal erythema present. No oropharyngeal exudate, posterior oropharyngeal edema or tonsillar abscesses.  Eyes: Conjunctivae are normal.  Neck: Neck supple.  Cardiovascular: Normal rate, regular rhythm, normal heart sounds and intact  distal pulses.   Pulmonary/Chest: Effort normal and breath sounds normal. No respiratory distress. She has no wheezes. She has no rales.  Abdominal: She exhibits no distension.  Musculoskeletal: Normal range of motion.  Neurological: She is alert and oriented to person, place, and time.  Skin: Skin is warm and dry.  Psychiatric: She has a normal mood and affect.  Nursing note and vitals reviewed.    ED Treatments / Results  DIAGNOSTIC STUDIES:  Oxygen Saturation is 98% on RA, normal by my interpretation.    COORDINATION OF CARE:  2:23 PM Discussed treatment plan with pt at bedside and pt agreed to plan.  Labs (all labs ordered are listed, but only abnormal results are displayed) Labs Reviewed - No data to display  EKG  EKG Interpretation None       Radiology No results found.  Procedures Procedures (including critical care time)  Medications Ordered in ED Medications - No data to display   Initial Impression / Assessment and Plan / ED Course  I have reviewed the triage vital signs and the nursing notes.  Pertinent labs & imaging results that were available during my care of the patient were reviewed by me and considered in my medical decision making (see chart for details).  Clinical Course     Patient with flulike symptoms. Reports productive cough with thick colored sputum. Reports severe chest pain with coughing. Will get chest x-ray for further evaluation. Otherwise vital signs are normal, she is in no acute distress, will give cough medication, Toradol for body aches and headache. Will reassess.  3:55 PM Chest x-ray shows very mild lingual pneumonia. Will place on Zithromax. Patient feels much better after Hycodan and Toradol. She is drinking fluids. Vital signs remain normal. No evidence of sepsis. Will place on Z-Pak, otherwise symptomatic treatment with ibuprofen, Tylenol, cough medications, follow-up with primary care doctor. Return precautions  discussed  Vitals:   06/20/16 1302  BP: 140/85  Pulse: 94  Resp: 18  Temp: 99.1 F (37.3 C)  TempSrc: Oral  SpO2: 98%     Final Clinical Impressions(s) / ED Diagnoses   Final diagnoses:  Community acquired pneumonia of left lower lobe of lung (HCC)    New Prescriptions New Prescriptions   No medications on file   I personally performed the services described in this documentation, which was scribed in my presence. The recorded information has been reviewed and is accurate.     Jaynie Crumbleatyana Khara Renaud, PA-C 06/20/16 1608    Canary Brimhristopher J Tegeler, MD 06/20/16 [phone]

## 2016-06-20 NOTE — ED Notes (Signed)
Declined W/C at D/C and was escorted to lobby by RN. 

## 2016-06-20 NOTE — Discharge Instructions (Signed)
Ibuprofen or tylenol for fever and body aches. Take zithromax as prescribed until all gone for infection. Hycodan for cough. Do not drive while taking this medication. Follow up with family doctor in 3 days for recheck. Return if worsening symptoms.

## 2016-06-20 NOTE — ED Triage Notes (Signed)
Patient here with 4 days of cough, body aches and productive cough, pain with cough. Alert and oriented, NAD

## 2017-01-18 ENCOUNTER — Emergency Department (HOSPITAL_COMMUNITY)
Admission: EM | Admit: 2017-01-18 | Discharge: 2017-01-18 | Disposition: A | Discharge status: Home / Self Care | Payer: Self-pay | Attending: Emergency Medicine | Admitting: Emergency Medicine

## 2017-01-18 ENCOUNTER — Encounter (HOSPITAL_COMMUNITY): Payer: Self-pay | Admitting: Emergency Medicine

## 2017-01-18 DIAGNOSIS — K0889 Other specified disorders of teeth and supporting structures: Secondary | ICD-10-CM | POA: Insufficient documentation

## 2017-01-18 DIAGNOSIS — Z79899 Other long term (current) drug therapy: Secondary | ICD-10-CM | POA: Insufficient documentation

## 2017-01-18 DIAGNOSIS — D649 Anemia, unspecified: Secondary | ICD-10-CM | POA: Insufficient documentation

## 2017-01-18 DIAGNOSIS — F17210 Nicotine dependence, cigarettes, uncomplicated: Secondary | ICD-10-CM | POA: Insufficient documentation

## 2017-01-18 MED ORDER — BUPIVACAINE-EPINEPHRINE (PF) 0.5% -1:200000 IJ SOLN
1.8000 mL | Freq: Once | INTRAMUSCULAR | Status: AC
  Administered 2017-01-18: 1.8 mL
  Filled 2017-01-18: qty 1.8

## 2017-01-18 MED ORDER — LIDOCAINE VISCOUS 2 % MT SOLN: 15.0000 mL | OROMUCOSAL | 2 refills | Status: DC | PRN

## 2017-01-18 MED ORDER — IBUPROFEN 600 MG PO TABS: 600.0000 mg | ORAL_TABLET | Freq: Four times a day (QID) | ORAL | 0 refills | Status: DC | PRN

## 2017-01-18 NOTE — ED Triage Notes (Signed)
Pt from home with complaints of lower left dental pain x 2 months. Pt states she has had swelling in lower left jaw that was worse before today. Minimal swelling noted at time of assessment. Pt is able to move jaw and speak without difficulty. Pt is not febrile nor tachycardic.

## 2017-01-18 NOTE — ED Provider Notes (Signed)
[facility]-EMERGENCY DEPT Provider Note   CSN: [id number] Arrival date & time: 01/18/17  1219   By signing my name below, I, [name], attest that this documentation has been prepared under the direction and in the presence of Shawn Joy, PA-C Electronically Signed: [name], ED Scribe. 01/18/17. 10:41 PM.  History   Chief Complaint Chief Complaint  Patient presents with  . Dental Pain    HPI Samantha HighlandMurlene Dunavant is a 36 y.o. female who presents to the Emergency Department complaining of 9/10, aching, nonradiating left lower dental pain onset 2 months. Pt reports associated left lower jaw swelling and nausea. Pt has tried ibuprofen, aleve, Anbesol, and a soft diet with no relief of her symptoms. Denies being evaluated by a dentist due to financial reasons. She denies fever, chills, drainage, vomiting, difficulty breathing or swallowing, or any other complaints.    The history is provided by the patient. No language interpreter was used.    Past Medical History:  Diagnosis Date  . Anemia     Patient Active Problem List   Diagnosis Date Noted  . LOSS OF WEIGHT 02/20/2008    Past Surgical History:  Procedure Laterality Date  . CESAREAN SECTION    . OVARIAN CYST REMOVAL      OB History    Gravida Para Term Preterm AB Living   3 3 3     3    SAB TAB Ectopic Multiple Live Births                   Home Medications    Prior to Admission medications   Medication Sig Start Date End Date Taking? Authorizing Provider  albuterol (PROVENTIL HFA;VENTOLIN HFA) 108 (90 BASE) MCG/ACT inhaler Inhale 1-2 puffs into the lungs every 6 (six) hours as needed for wheezing or shortness of breath. Reported on 10/21/2015    [provider]  HYDROcodone-homatropine (HYCODAN) 5-1.5 MG/5ML syrup Take 5 mLs by mouth every 6 (six) hours as needed for cough. 06/20/16   Kirichenko, Tatyana, PA-C  ibuprofen (ADVIL,MOTRIN) 600 MG tablet Take 1 tablet (600 mg total) by mouth every 6 (six) hours as  needed. 01/18/17   Joy, Shawn C, PA-C  lidocaine (XYLOCAINE) 2 % solution Use as directed 15 mLs in the mouth or throat as needed for mouth pain. 01/18/17   Joy, Shawn C, PA-C  methocarbamol (ROBAXIN) 500 MG tablet Take 1 tablet (500 mg total) by mouth 2 (two) times daily. 03/21/16   Janne NapoleonNeese, Hope M, NP    Family History Family History  Problem Relation Age of Onset  . Hypertension Mother   . Diabetes Mother   . Hypertension Father   . Breast cancer Maternal Grandmother     Social History Social History  Substance Use Topics  . Smoking status: Current Every Day Smoker    Packs/day: 0.50    Types: Cigarettes, Cigars  . Smokeless tobacco: Never Used  . Alcohol use Yes     Comment: rarely     Allergies   Hydrocodone   Review of Systems Review of Systems  Constitutional: Negative for chills and fever.  HENT: Positive for dental problem (left lower). Negative for drooling, trouble swallowing and voice change.        No drainage to the affected tooth.  +left lower jaw swelling  Respiratory: Negative for shortness of breath.   Gastrointestinal: Positive for nausea. Negative for vomiting.  Musculoskeletal: Negative for neck pain and neck stiffness.     Physical Exam Updated Vital  Signs BP 123/88 (BP Location: Right Arm)   Pulse 88   Temp 99.3 F (37.4 C) (Oral)   Resp 16   Ht 5\' 4"  (1.626 m)   Wt 133 lb (60.3 kg)   LMP 01/01/2017   SpO2 100%   BMI 22.83 kg/m   Physical Exam  Constitutional: She appears well-developed and well-nourished. No distress.  HENT:  Head: Normocephalic and atraumatic.  Mouth/Throat: Uvula is midline and mucous membranes are normal. Abnormal dentition. No dental abscesses.  Erosion of the left lower rear most molar down to the level of the gumline with some tenderness to the surrounding gingiva but no noted swelling or fluctuance. Handles oral secretions without difficulty. Mouth opening to at least 3 finger widths.   Eyes: Conjunctivae are  normal.  Neck: Normal range of motion. Neck supple.  No submandibular or submental swelling.  Cardiovascular: Normal rate and regular rhythm.   Pulmonary/Chest: Effort normal.  Lymphadenopathy:    She has no cervical adenopathy.  Neurological: She is alert.  Skin: Skin is warm and dry. She is not diaphoretic.  Psychiatric: She has a normal mood and affect. Her behavior is normal.  Nursing note and vitals reviewed.    ED Treatments / Results  DIAGNOSTIC STUDIES: Oxygen Saturation is 100% on RA, nl by my interpretation.    COORDINATION OF CARE: 10:41 PM Discussed treatment plan with pt at bedside and pt agreed to plan.   Labs (all labs ordered are listed, but only abnormal results are displayed) Labs Reviewed - No data to display  EKG  EKG Interpretation None       Radiology No results found.  Procedures Dental Block Date/Time: 01/18/2017 1:37 PM Performed by: Anselm Pancoast Authorized by: Anselm Pancoast   Consent:    Consent obtained:  Verbal   Consent given by:  Patient   Risks discussed:  Pain, swelling and unsuccessful block Indications:    Indications: dental pain   Location:    Block type:  Inferior alveolar   Laterality:  Left Procedure details (see MAR for exact dosages):    Syringe type:  Controlled syringe   Needle gauge:  27 G   Anesthetic injected:  Bupivacaine 0.5% WITH epi   Injection procedure:  Anatomic landmarks identified and anatomic landmarks palpated Post-procedure details:    Outcome:  Pain relieved   Patient tolerance of procedure:  Tolerated well, no immediate complications     (including critical care time)  Medications Ordered in ED Medications  bupivacaine-epinephrine (MARCAINE W/ EPI) 0.5% -1:200000 injection 1.8 mL (1.8 mLs Infiltration Given 01/18/17 1351)     Initial Impression / Assessment and Plan / ED Course  I have reviewed the triage vital signs and the nursing notes.  Pertinent labs & imaging results that were  available during my care of the patient were reviewed by me and considered in my medical decision making (see chart for details).      Patient presents with dental pain. Patient nontoxic appearing. No signs of sepsis or Ludwig's angioedema. Dental block performed with improvement in patient's symptoms. Dental follow-up recommended. Resources given. The patient was given instructions for home care as well as return precautions. Patient voices understanding of these instructions, accepts the plan, and is comfortable with discharge.    Final Clinical Impressions(s) / ED Diagnoses   Final diagnoses:  Pain, dental    New Prescriptions Discharge Medication List as of 01/18/2017  2:00 PM    START taking these medications   Details  lidocaine (XYLOCAINE) 2 % solution Use as directed 15 mLs in the mouth or throat as needed for mouth pain., Starting Thu 01/18/2017, Print       I personally performed the services described in this documentation, which was scribed in my presence. The recorded information has been reviewed and is accurate.    Concepcion Living 01/18/17 2241    Azalia Bilis, MD 01/19/17 [id number]

## 2017-01-18 NOTE — Discharge Instructions (Signed)
You have been seen today for dental pain. You should follow up with a dentist as soon as possible. This problem will not resolve on its own without the care of a dentist. Use ibuprofen or naproxen for pain. Use the viscous lidocaine for mouth pain. Swish with the lidocaine and spit it out. Do not swallow it. ° °Antiinflammatory medications: Take 600 mg of ibuprofen every 6 hours or 440 mg (over the counter dose) to 500 mg (prescription dose) of naproxen every 12 hours or for the next 3 days. After this time, these medications may be used as needed for pain. Take these medications with food to avoid upset stomach. Choose only one of these medications, do not take them together. °Tylenol: Should you continue to have additional pain while taking the ibuprofen or naproxen, you may add in tylenol as needed. Your daily total maximum amount of tylenol from all sources should be limited to 4000mg/day for persons without liver problems, or 2000mg/day for those with liver problems. ° °Dental Resource Guide ° °[facility] °[address], [address] °[place], [place] [address] °[phone] ° °[facility] °[address] °[place], [place] [address] °[phone] ° °[facility] °[address] °[place], [place] [address] °[phone]. [phone] ° °[facility] °[address] °[place], [place] [address] °[phone] ° °[facility] °[address] °[place], [place] [address] °[phone] ° °[employer] °[facility].[facility]/patientcare/studentclinics/becomepatient ° °[employer] [facility] [employer] [facility] °[address] °[place], [place] [address] °[phone] ° °Website for free, low-income, or sliding scale dental services in [place]: °www.freedental.us ° °To find a dentist in [place] and surrounding areas: °www.ncdental.org/for-the-public/find-a-dentist ° °[facility] °http://www.ncdental.org/meetings-events/[place] ° °[place] [facility] °https://dma.ncdhhs.gov/find-a-doctor/medicaid-dental-providers ° °

## 2017-01-29 ENCOUNTER — Encounter (HOSPITAL_COMMUNITY): Payer: Self-pay

## 2017-01-29 ENCOUNTER — Emergency Department (HOSPITAL_COMMUNITY)
Admission: EM | Admit: 2017-01-29 | Discharge: 2017-01-29 | Disposition: A | Discharge status: Home / Self Care | Payer: Self-pay | Attending: Emergency Medicine | Admitting: Emergency Medicine

## 2017-01-29 ENCOUNTER — Emergency Department (HOSPITAL_COMMUNITY): Payer: Self-pay

## 2017-01-29 DIAGNOSIS — N390 Urinary tract infection, site not specified: Secondary | ICD-10-CM | POA: Insufficient documentation

## 2017-01-29 DIAGNOSIS — K529 Noninfective gastroenteritis and colitis, unspecified: Secondary | ICD-10-CM | POA: Insufficient documentation

## 2017-01-29 DIAGNOSIS — F17210 Nicotine dependence, cigarettes, uncomplicated: Secondary | ICD-10-CM | POA: Insufficient documentation

## 2017-01-29 LAB — CBC
HCT: 28.2 % — ABNORMAL LOW (ref 36.0–46.0)
HEMOGLOBIN: 8.7 g/dL — AB (ref 12.0–15.0)
MCH: 20.4 pg — ABNORMAL LOW (ref 26.0–34.0)
MCHC: 30.9 g/dL (ref 30.0–36.0)
MCV: 66 fL — ABNORMAL LOW (ref 78.0–100.0)
PLATELETS: 490 10*3/uL — AB (ref 150–400)
RBC: 4.27 MIL/uL (ref 3.87–5.11)
RDW: 17.8 % — ABNORMAL HIGH (ref 11.5–15.5)
WBC: 5.3 10*3/uL (ref 4.0–10.5)

## 2017-01-29 LAB — URINALYSIS, ROUTINE W REFLEX MICROSCOPIC
BILIRUBIN URINE: NEGATIVE
Glucose, UA: NEGATIVE mg/dL
Ketones, ur: NEGATIVE mg/dL
NITRITE: POSITIVE — AB
PROTEIN: 100 mg/dL — AB
SPECIFIC GRAVITY, URINE: 1.025 (ref 1.005–1.030)
pH: 6 (ref 5.0–8.0)

## 2017-01-29 LAB — COMPREHENSIVE METABOLIC PANEL
ALBUMIN: 3.3 g/dL — AB (ref 3.5–5.0)
ALK PHOS: 49 U/L (ref 38–126)
ALT: 16 U/L (ref 14–54)
AST: 22 U/L (ref 15–41)
Anion gap: 5 (ref 5–15)
BILIRUBIN TOTAL: 0.2 mg/dL — AB (ref 0.3–1.2)
BUN: 11 mg/dL (ref 6–20)
CALCIUM: 8.6 mg/dL — AB (ref 8.9–10.3)
CO2: 25 mmol/L (ref 22–32)
Chloride: 109 mmol/L (ref 101–111)
Creatinine, Ser: 0.78 mg/dL (ref 0.44–1.00)
GFR calc Af Amer: >60 mL/min (ref >60)
GFR calc non Af Amer: >60 mL/min (ref >60)
Glucose, Bld: 90 mg/dL (ref 65–99)
Potassium: 3.6 mmol/L (ref 3.5–5.1)
Sodium: 139 mmol/L (ref 135–145)
TOTAL PROTEIN: 7.3 g/dL (ref 6.5–8.1)

## 2017-01-29 LAB — PREGNANCY, URINE: Preg Test, Ur: NEGATIVE

## 2017-01-29 LAB — URINALYSIS, MICROSCOPIC (REFLEX)

## 2017-01-29 LAB — I-STAT BETA HCG BLOOD, ED (MC, WL, AP ONLY)

## 2017-01-29 LAB — LIPASE, BLOOD: Lipase: 18 U/L (ref 11–51)

## 2017-01-29 MED ORDER — METRONIDAZOLE 500 MG PO TABS: 500.0000 mg | ORAL_TABLET | Freq: Three times a day (TID) | ORAL | 0 refills | Status: AC

## 2017-01-29 MED ORDER — SODIUM CHLORIDE 0.9 % IV BOLUS (SEPSIS)
1000.0000 mL | Freq: Once | INTRAVENOUS | Status: AC
  Administered 2017-01-29: 1000 mL via INTRAVENOUS

## 2017-01-29 MED ORDER — ONDANSETRON HCL 4 MG/2ML IJ SOLN
4.0000 mg | Freq: Once | INTRAMUSCULAR | Status: AC
  Administered 2017-01-29: 4 mg via INTRAVENOUS
  Filled 2017-01-29: qty 2

## 2017-01-29 MED ORDER — GI COCKTAIL ~~LOC~~
30.0000 mL | Freq: Once | ORAL | Status: AC
  Administered 2017-01-29: 30 mL via ORAL
  Filled 2017-01-29: qty 30

## 2017-01-29 MED ORDER — MORPHINE SULFATE (PF) 4 MG/ML IV SOLN
4.0000 mg | Freq: Once | INTRAVENOUS | Status: AC
Start: 2017-01-29 — End: 2017-01-29
  Administered 2017-01-29: 4 mg via INTRAVENOUS
  Filled 2017-01-29: qty 1

## 2017-01-29 MED ORDER — CIPROFLOXACIN HCL 500 MG PO TABS: 500.0000 mg | ORAL_TABLET | Freq: Two times a day (BID) | ORAL | 0 refills | Status: AC

## 2017-01-29 NOTE — ED Triage Notes (Signed)
Patient c/o mid epigastric pain x 4 days. Patient also c/o nausea and diarrhea. Patient denies any blood in her stool.

## 2017-01-29 NOTE — Discharge Instructions (Signed)
Please you touch information regarding her condition. Take ciprofloxacin and Flagyl as directed for one week. Follow-up with GI specialist is a 12 for further evaluation. Return to ED for worsening abdominal pain, fevers, blood in stool, increased vomiting, lightheadedness, loss of consciousness.

## 2017-01-29 NOTE — ED Provider Notes (Signed)
[facility]-EMERGENCY DEPT Provider Note   CSN: [id number] Arrival date & time: 01/29/17  1229     History   Chief Complaint Chief Complaint  Patient presents with  . Abdominal Pain  . Nausea  . Diarrhea    HPI [name] is a 36 y.o. female.  HPI  Patient presents to ED for evaluation of midepigastric abdominal pain for the past 4 days associated with nausea and diarrhea. She describes the pain as "crampy, worse than labor pains." She states that she has a history of gastritis and states that this feels similar, however she does not usually have diarrhea with it. She has tried baking soda, Ginger root tea with no improvement in symptoms. She also reports a dry cough. She denies any fevers, chills, vomiting, blood in stool, lightheadedness, urinary symptoms, vaginal complaints. She does report daily marijuana use, sometimes 3 times daily. She reports daily tobacco use and intermittent alcohol use. Past surgical history includes C-section and ovarian cyst removal several years ago.  Past Medical History:  Diagnosis Date  . Anemia     Patient Active Problem List   Diagnosis Date Noted  . LOSS OF WEIGHT 02/20/2008    Past Surgical History:  Procedure Laterality Date  . CESAREAN SECTION    . OVARIAN CYST REMOVAL      OB History    Gravida Para Term Preterm AB Living   3 3 3     3    SAB TAB Ectopic Multiple Live Births                   Home Medications    Prior to Admission medications   Medication Sig Start Date End Date Taking? Authorizing Provider  ibuprofen (ADVIL,MOTRIN) 600 MG tablet Take 1 tablet (600 mg total) by mouth every 6 (six) hours as needed. 01/18/17  Yes Joy, Shawn C, PA-C  lidocaine (XYLOCAINE) 2 % solution Use as directed 15 mLs in the mouth or throat as needed for mouth pain. 01/18/17  Yes Joy, Shawn C, PA-C  ciprofloxacin (CIPRO) 500 MG tablet Take 1 tablet (500 mg total) by mouth 2 (two) times daily. 01/29/17 02/05/17  Jayli Fogleman, PA-C    HYDROcodone-homatropine (HYCODAN) 5-1.5 MG/5ML syrup Take 5 mLs by mouth every 6 (six) hours as needed for cough. Patient not taking: Reported on 01/29/2017 06/20/16   Jaynie Crumble, PA-C  metroNIDAZOLE (FLAGYL) 500 MG tablet Take 1 tablet (500 mg total) by mouth 3 (three) times daily. 01/29/17 02/05/17  Dietrich Pates, PA-C    Family History Family History  Problem Relation Age of Onset  . Hypertension Mother   . Diabetes Mother   . Hypertension Father   . Breast cancer Maternal Grandmother     Social History Social History  Substance Use Topics  . Smoking status: Current Every Day Smoker    Packs/day: 0.50    Types: Cigarettes, Cigars  . Smokeless tobacco: Never Used  . Alcohol use Yes     Comment: rarely     Allergies   Hydrocodone   Review of Systems Review of Systems  Constitutional: Positive for appetite change. Negative for chills and fever.  HENT: Negative for ear pain, rhinorrhea, sneezing and sore throat.   Eyes: Negative for photophobia and visual disturbance.  Respiratory: Positive for cough. Negative for chest tightness, shortness of breath and wheezing.   Cardiovascular: Negative for chest pain and palpitations.  Gastrointestinal: Positive for abdominal pain, diarrhea and nausea. Negative for blood in stool, constipation and vomiting.  Genitourinary: Negative for dysuria, hematuria, urgency, vaginal bleeding and vaginal discharge.  Musculoskeletal: Negative for myalgias.  Skin: Negative for rash.  Neurological: Negative for dizziness, weakness, light-headedness and numbness.     Physical Exam Updated Vital Signs BP (!) 123/91   Pulse (!) 56   Temp 98.1 F (36.7 C) (Oral)   Resp 18   Ht 5\' 4"  (1.626 m)   Wt 60.3 kg (133 lb)   LMP 01/29/2017   SpO2 96%   BMI 22.83 kg/m   Physical Exam  Constitutional: She appears well-developed and well-nourished. No distress.  HENT:  Head: Normocephalic and atraumatic.  Nose: Nose normal.  Eyes:  Conjunctivae and EOM are normal. Left eye exhibits no discharge. No scleral icterus.  Neck: Normal range of motion. Neck supple.  Cardiovascular: Normal rate, regular rhythm, normal heart sounds and intact distal pulses.  Exam reveals no gallop and no friction rub.   No murmur heard. Pulmonary/Chest: Effort normal and breath sounds normal. No respiratory distress.  Abdominal: Soft. Bowel sounds are normal. She exhibits no distension. There is tenderness. There is no guarding.    Musculoskeletal: Normal range of motion. She exhibits no edema.  Neurological: She is alert. She exhibits normal muscle tone. Coordination normal.  Skin: Skin is warm and dry. No rash noted.  Psychiatric: She has a normal mood and affect.  Nursing note and vitals reviewed.    ED Treatments / Results  Labs (all labs ordered are listed, but only abnormal results are displayed) Labs Reviewed  COMPREHENSIVE METABOLIC PANEL - Abnormal; Notable for the following:       Result Value   Calcium 8.6 (*)    Albumin 3.3 (*)    Total Bilirubin 0.2 (*)    All other components within normal limits  CBC - Abnormal; Notable for the following:    Hemoglobin 8.7 (*)    HCT 28.2 (*)    MCV 66.0 (*)    MCH 20.4 (*)    RDW 17.8 (*)    Platelets 490 (*)    All other components within normal limits  URINALYSIS, ROUTINE W REFLEX MICROSCOPIC - Abnormal; Notable for the following:    APPearance CLOUDY (*)    Hgb urine dipstick LARGE (*)    Protein, ur 100 (*)    Nitrite POSITIVE (*)    Leukocytes, UA TRACE (*)    All other components within normal limits  URINALYSIS, MICROSCOPIC (REFLEX) - Abnormal; Notable for the following:    Bacteria, UA MANY (*)    Squamous Epithelial / LPF 0-5 (*)    All other components within normal limits  LIPASE, BLOOD  PREGNANCY, URINE  I-STAT BETA HCG BLOOD, ED ([facility], [facility], [facility] ONLY)    EKG  EKG Interpretation None       Radiology Dg Abdomen 1 View  Result Date: 01/29/2017 CLINICAL  DATA:  UTI, anemia, dehydration, epigastric/upper abdominal pain below sternum, umbilical pain, RIGHT lower abdominal pain as well, smoker EXAM: ABDOMEN - 1 VIEW COMPARISON:  None FINDINGS: Few air-filled loops of small bowel in the LEFT mid abdomen with questionable mild bowel wall thickening. Small bowel enteritis not excluded. No evidence of bowel obstruction or dilatation. Osseous structures unremarkable. Small RIGHT pelvic phleboliths. IMPRESSION: Questionable mild bowel wall thickening of small bowel loops in the LEFT mid abdomen, cannot exclude small bowel enteritis such as from infection or inflammatory bowel disease. Remainder of exam unremarkable. Electronically Signed   By: Ulyses Southward M.D.   On: 01/29/2017 18:13  Procedures Procedures (including critical care time)  Medications Ordered in ED Medications  sodium chloride 0.9 % bolus 1,000 mL (0 mLs Intravenous Stopped 01/29/17 1847)  morphine 4 MG/ML injection 4 mg (4 mg Intravenous Given 01/29/17 1601)  ondansetron (ZOFRAN) injection 4 mg (4 mg Intravenous Given 01/29/17 1600)  gi cocktail (Maalox,Lidocaine,Donnatal) (30 mLs Oral Given 01/29/17 1601)     Initial Impression / Assessment and Plan / ED Course  I have reviewed the triage vital signs and the nursing notes.  Pertinent labs & imaging results that were available during my care of the patient were reviewed by me and considered in my medical decision making (see chart for details).     Patient presents to ED for evaluation of midepigastric abdominal pain for the past 4 days associated with nausea and diarrhea. She does report daily marijuana use sometimes 3 times daily. She also reports daily tobacco use and intermittent alcohol use. On physical exam she does have tenderness to palpation in the epigastric area and right lower quadrant with no rebound or guarding present. She is afebrile with no history of fever. CBC shows anemia which patient states that she does have a  history of. CMP unremarkable. Lipase unremarkable. Pregnancy negative. Urinalysis showed evidence of UTI with nitrite, leukocytes and bacteria. X-ray shows evidence of infectious or inflammatory colitis. Patient reports much improvement in symptoms with Zofran, fluids and GI cocktail.  We'll discharge with antibiotics to help with UTI and colitis. I have low suspicion for appendicitis or other acute surgical emergency pain and caused her abdominal pain. Advised patient to follow-up with PCP for further evaluation. Patient appears stable for discharge at this time. Strict return precautions given.  Final Clinical Impressions(s) / ED Diagnoses   Final diagnoses:  Colitis  Lower urinary tract infectious disease    New Prescriptions Discharge Medication List as of 01/29/2017  6:27 PM    START taking these medications   Details  ciprofloxacin (CIPRO) 500 MG tablet Take 1 tablet (500 mg total) by mouth 2 (two) times daily., Starting Mon 01/29/2017, Until Mon 02/05/2017, Print    metroNIDAZOLE (FLAGYL) 500 MG tablet Take 1 tablet (500 mg total) by mouth 3 (three) times daily., Starting Mon 01/29/2017, Until Mon 02/05/2017, Print         Idelle Leech, [place], PA-C 01/29/17 2210    Mancel Bale, MD 01/30/17 [phone]

## 2017-01-29 NOTE — ED Notes (Signed)
Patient transported to X-ray 

## 2018-12-02 ENCOUNTER — Ambulatory Visit (HOSPITAL_COMMUNITY)
Admission: EM | Admit: 2018-12-02 | Discharge: 2018-12-02 | Disposition: A | Discharge status: Home / Self Care | Payer: Self-pay | Attending: Family Medicine | Admitting: Family Medicine

## 2018-12-02 ENCOUNTER — Encounter (HOSPITAL_COMMUNITY): Payer: Self-pay | Admitting: Emergency Medicine

## 2018-12-02 ENCOUNTER — Other Ambulatory Visit: Payer: Self-pay

## 2018-12-02 DIAGNOSIS — H5789 Other specified disorders of eye and adnexa: Secondary | ICD-10-CM

## 2018-12-02 NOTE — ED Triage Notes (Signed)
Pt with swelling to left eye after falling 1 week ago and landing on edge of table; swelling still noted although improved per pt

## 2018-12-02 NOTE — ED Provider Notes (Signed)
[place]    CSN: [id number] Arrival date & time: 12/02/18  1339     History   Chief Complaint Chief Complaint  Patient presents with  . Facial Swelling    HPI [name] is a 38 y.o. female presenting for left upper eyelid swelling.  Patient states that on 6/14 she was bending over to pick something up when she hit her head against a table.  Patient states that she had significant swelling, bruising, subconjunctival hemorrhage thereafter.  Patient has been using hot compresses twice daily, a "bruising gel ", and ibuprofen with moderate relief of pain and swelling.  Patient has bruising has largely resolved, her vision has remained intact the entire time, she just "has this egg on my eye ".  Past Medical History:  Diagnosis Date  . Anemia     Patient Active Problem List   Diagnosis Date Noted  . LOSS OF WEIGHT 02/20/2008    Past Surgical History:  Procedure Laterality Date  . CESAREAN SECTION    . OVARIAN CYST REMOVAL      OB History    Gravida  3   Para  3   Term  3   Preterm      AB      Living  3     SAB      TAB      Ectopic      Multiple      Live Births               Home Medications    Prior to Admission medications   Medication Sig Start Date End Date Taking? Authorizing Provider  ibuprofen (ADVIL,MOTRIN) 600 MG tablet Take 1 tablet (600 mg total) by mouth every 6 (six) hours as needed. 01/18/17   Joy, Shawn C, PA-C  lidocaine (XYLOCAINE) 2 % solution Use as directed 15 mLs in the mouth or throat as needed for mouth pain. 01/18/17   Joy, Helane Gunther, PA-C    Family History Family History  Problem Relation Age of Onset  . Hypertension Mother   . Diabetes Mother   . Hypertension Father   . Breast cancer Maternal Grandmother     Social History Social History   Tobacco Use  . Smoking status: Current Every Day Smoker    Packs/day: 0.50    Types: Cigarettes, Cigars  . Smokeless tobacco: Never Used  Substance Use  Topics  . Alcohol use: Yes    Comment: rarely  . Drug use: Yes    Frequency: 1.0 times per week    Types: Marijuana    Comment: twice a day     Allergies   Hydrocodone   Review of Systems As per HPI   Physical Exam Triage Vital Signs ED Triage Vitals [12/02/18 1417]  Enc Vitals Group     BP 131/80     Pulse Rate 92     Resp 18     Temp 98.5 F (36.9 C)     Temp Source Oral     SpO2 98 %     Weight      Height      Head Circumference      Peak Flow      Pain Score 2     Pain Loc      Pain Edu?      Excl. in [place]?    No data found.  Updated Vital Signs BP 131/80 (BP Location: Right Arm)   Pulse 92  Temp 98.5 F (36.9 C) (Oral)   Resp 18   SpO2 98%   Visual Acuity Right Eye Distance:   Left Eye Distance:   Bilateral Distance:    Right Eye Near:   Left Eye Near:    Bilateral Near:     Physical Exam Constitutional:      General: She is not in acute distress. HENT:     Head: Normocephalic and atraumatic.     Mouth/Throat:     Mouth: Mucous membranes are moist.  Eyes:     General: No scleral icterus.       Right eye: No discharge.        Left eye: No discharge.     Extraocular Movements: Extraocular movements intact.     Pupils: Pupils are equal, round, and reactive to light.     Comments: No conjunctival injection or chemosis bilaterally, though resolving small subconjunctival hemorrhage noted adjacent to lateral aspect of iris, spares limbus.  Large (3 cm) egg-shaped lesion on lateral aspect of left upper lid.  Tender to palpation, without warmth or erythema-mild fluctuance near center of lesion.  No open wound on eyelid, or inside lid with eversion.  Purulent discharge, visual fields intact bilaterally and symmetric.  EOMs are nontender.  Cardiovascular:     Rate and Rhythm: Normal rate.  Pulmonary:     Effort: Pulmonary effort is normal.  Skin:    Coloration: Skin is not jaundiced or pale.  Neurological:     Mental Status: She is alert and  oriented to person, place, and time.      UC Treatments / Results  Labs (all labs ordered are listed, but only abnormal results are displayed) Labs Reviewed - No data to display  EKG None  Radiology No results found.  Procedures Procedures (including critical care time)  Medications Ordered in UC Medications - No data to display  Initial Impression / Assessment and Plan / UC Course  I have reviewed the triage vital signs and the nursing notes.  Pertinent labs & imaging results that were available during my care of the patient were reviewed by me and considered in my medical decision making (see chart for details).     38 year old female presenting for right upper eyelid swelling status post hitting her head 2 weeks ago.  Was able to provide photos of eye shortly after incident, appears to be resolving.  Exam reassuring, encourage increased hot compresses.  Offered needle aspiration which patient declined due to having another medical appointment here after.  Return precautions discussed, patient verbalized understanding. Final Clinical Impressions(s) / UC Diagnoses   Final diagnoses:  Eye swelling, left     Discharge Instructions     Increase frequency of hot compress use. May return to this clinic, or follow-up with ophthalmology if symptoms worsen or your lesion does not resolve in the next week.    ED Prescriptions    None     Controlled Substance Prescriptions [place] Controlled Substance Registry consulted? Not Applicable   Shea EvansHall-Potvin, , [place] JerseyPA-C 12/02/18 1520

## 2018-12-02 NOTE — Discharge Instructions (Addendum)
Increase frequency of hot compress use. May return to this clinic, or follow-up with ophthalmology if symptoms worsen or your lesion does not resolve in the next week.

## 2018-12-23 ENCOUNTER — Ambulatory Visit (HOSPITAL_COMMUNITY)
Admission: EM | Admit: 2018-12-23 | Discharge: 2018-12-23 | Disposition: A | Discharge status: Home / Self Care | Payer: Self-pay | Attending: Family Medicine | Admitting: Family Medicine

## 2018-12-23 ENCOUNTER — Other Ambulatory Visit: Payer: Self-pay

## 2018-12-23 ENCOUNTER — Encounter (HOSPITAL_COMMUNITY): Payer: Self-pay | Admitting: Emergency Medicine

## 2018-12-23 DIAGNOSIS — B029 Zoster without complications: Secondary | ICD-10-CM

## 2018-12-23 MED ORDER — VALACYCLOVIR HCL 1 G PO TABS: 1000.0000 mg | ORAL_TABLET | Freq: Three times a day (TID) | ORAL | 0 refills | Status: DC

## 2018-12-23 NOTE — ED Triage Notes (Signed)
Pt was here in June for swelling to left eye that is improved but still has some pain; pt now has rash to right leg and arm with itching

## 2018-12-23 NOTE — ED Provider Notes (Signed)
[place]    CSN: [id number] Arrival date & time: 12/23/18  1443     History   Chief Complaint Chief Complaint  Patient presents with  . Rash    HPI [name] is a 38 y.o. female.   HPI  Patient is here complaining of a painful rash to her right leg.  It started in her thigh.  It is now spreading downward towards her calf.  It is been spreading over the last 2 to 3 days.  It is getting worse.  She tried some cortisone cream.  This did not help.  She has not taken any medicine for pain. Patient did have chickenpox as a child. Patient states she has normal immunity.  She has hepatitis and HIV testing done earlier this year when incarcerated  Past Medical History:  Diagnosis Date  . Anemia     Patient Active Problem List   Diagnosis Date Noted  . LOSS OF WEIGHT 02/20/2008    Past Surgical History:  Procedure Laterality Date  . CESAREAN SECTION    . OVARIAN CYST REMOVAL      OB History    Gravida  3   Para  3   Term  3   Preterm      AB      Living  3     SAB      TAB      Ectopic      Multiple      Live Births               Home Medications    Prior to Admission medications   Medication Sig Start Date End Date Taking? Authorizing Provider  ibuprofen (ADVIL,MOTRIN) 600 MG tablet Take 1 tablet (600 mg total) by mouth every 6 (six) hours as needed. 01/18/17   Joy, Shawn C, PA-C  valACYclovir (VALTREX) 1000 MG tablet Take 1 tablet (1,000 mg total) by mouth 3 (three) times daily. 12/23/18   Raylene Everts, MD    Family History Family History  Problem Relation Age of Onset  . Hypertension Mother   . Diabetes Mother   . Hypertension Father   . Breast cancer Maternal Grandmother     Social History Social History   Tobacco Use  . Smoking status: Current Every Day Smoker    Packs/day: 0.50    Types: Cigarettes, Cigars  . Smokeless tobacco: Never Used  Substance Use Topics  . Alcohol use: Yes    Comment: rarely  .  Drug use: Yes    Frequency: 1.0 times per week    Types: Marijuana    Comment: twice a day     Allergies   Hydrocodone   Review of Systems Review of Systems  Constitutional: Negative for chills and fever.  HENT: Negative for ear pain and sore throat.   Eyes: Negative for pain and visual disturbance.  Respiratory: Negative for cough and shortness of breath.   Cardiovascular: Negative for chest pain and palpitations.  Gastrointestinal: Negative for abdominal pain and vomiting.  Genitourinary: Negative for dysuria and hematuria.  Musculoskeletal: Negative for arthralgias and back pain.  Skin: Positive for rash. Negative for color change.  Neurological: Negative for seizures and syncope.  All other systems reviewed and are negative.    Physical Exam Triage Vital Signs ED Triage Vitals [12/23/18 1542]  Enc Vitals Group     BP (!) 143/79     Pulse Rate 76     Resp 18  Temp 98.4 F (36.9 C)     Temp Source Oral     SpO2 100 %     Weight      Height      Head Circumference      Peak Flow      Pain Score 5     Pain Loc      Pain Edu?      Excl. in [place]?    No data found.  Updated Vital Signs BP (!) 143/79 (BP Location: Right Arm)   Pulse 76   Temp 98.4 F (36.9 C) (Oral)   Resp 18   SpO2 100%   Visual Acuity   :     Physical Exam  Top picture he has the rash mid thigh or right leg   Second photo of right calf     UC Treatments / Results  Labs (all labs ordered are listed, but only abnormal results are displayed) Labs Reviewed - No data to display  EKG   Radiology No results found.  Procedures Procedures (including critical care time)  Medications Ordered in UC Medications - No data to display  Initial Impression / Assessment and Plan / UC Course  I have reviewed the triage vital signs and the nursing notes.  Pertinent labs & imaging results that were available during my care of the patient were reviewed by me and considered in my  medical decision making (see chart for details).     Painful rash in a dermatomal distribution is likely herpetic.  Vesicles are quite small. Final Clinical Impressions(s) / UC Diagnoses   Final diagnoses:  Thigh shingles     Discharge Instructions     Take the valacyclovir as directed Tylenol or ibuprofen/naproxen for pain Call or return if worse    ED Prescriptions    Medication Sig Dispense Auth. Provider   valACYclovir (VALTREX) 1000 MG tablet Take 1 tablet (1,000 mg total) by mouth 3 (three) times daily. 21 tablet Eustace MooreNelson, Ammy Lienhard Sue, MD     Controlled Substance Prescriptions [place] Controlled Substance Registry consulted? Not Applicable   Eustace MooreNelson, Makenzie Vittorio Sue, MD 12/23/18 [phone]

## 2018-12-23 NOTE — Discharge Instructions (Signed)
Take the valacyclovir as directed Tylenol or ibuprofen/naproxen for pain Call or return if worse

## 2019-09-20 ENCOUNTER — Encounter (HOSPITAL_BASED_OUTPATIENT_CLINIC_OR_DEPARTMENT_OTHER): Payer: Self-pay | Admitting: Emergency Medicine

## 2019-09-20 ENCOUNTER — Emergency Department (HOSPITAL_BASED_OUTPATIENT_CLINIC_OR_DEPARTMENT_OTHER): Payer: Self-pay

## 2019-09-20 ENCOUNTER — Other Ambulatory Visit: Payer: Self-pay

## 2019-09-20 ENCOUNTER — Emergency Department (HOSPITAL_BASED_OUTPATIENT_CLINIC_OR_DEPARTMENT_OTHER)
Admission: EM | Admit: 2019-09-20 | Discharge: 2019-09-20 | Disposition: A | Discharge status: Home / Self Care | Payer: Self-pay | Attending: Emergency Medicine | Admitting: Emergency Medicine

## 2019-09-20 DIAGNOSIS — Y939 Activity, unspecified: Secondary | ICD-10-CM | POA: Insufficient documentation

## 2019-09-20 DIAGNOSIS — Y998 Other external cause status: Secondary | ICD-10-CM | POA: Insufficient documentation

## 2019-09-20 DIAGNOSIS — F1210 Cannabis abuse, uncomplicated: Secondary | ICD-10-CM | POA: Insufficient documentation

## 2019-09-20 DIAGNOSIS — F17290 Nicotine dependence, other tobacco product, uncomplicated: Secondary | ICD-10-CM | POA: Insufficient documentation

## 2019-09-20 DIAGNOSIS — X58XXXA Exposure to other specified factors, initial encounter: Secondary | ICD-10-CM | POA: Insufficient documentation

## 2019-09-20 DIAGNOSIS — F17210 Nicotine dependence, cigarettes, uncomplicated: Secondary | ICD-10-CM | POA: Insufficient documentation

## 2019-09-20 DIAGNOSIS — S92425A Nondisplaced fracture of distal phalanx of left great toe, initial encounter for closed fracture: Secondary | ICD-10-CM

## 2019-09-20 DIAGNOSIS — Y929 Unspecified place or not applicable: Secondary | ICD-10-CM | POA: Insufficient documentation

## 2019-09-20 MED ORDER — OXYCODONE-ACETAMINOPHEN 5-325 MG PO TABS: 1.0000 | ORAL_TABLET | ORAL | 0 refills | Status: AC | PRN

## 2019-09-20 MED ORDER — NAPROXEN 250 MG PO TABS
500.0000 mg | ORAL_TABLET | Freq: Once | ORAL | Status: AC
  Administered 2019-09-20: 500 mg via ORAL
  Filled 2019-09-20: qty 2

## 2019-09-20 NOTE — Discharge Instructions (Signed)
Recommend bearing weight as tolerated on your foot.  When walking or bearing any weight, please use the fracture shoe as discussed.  Recommend rest from strenuous activity, periodic ice, elevation.  Take Tylenol Motrin as needed for pain control.  For breakthrough pain, can use prescribed Percocet.  Note this can make you drowsy and should not be taken while driving or operating heavy machinery.

## 2019-09-20 NOTE — ED Triage Notes (Addendum)
Reports pain and swelling in LEFT great toe that she noticed yesterday afternoon.  Does not recall injuring foot.  Bruising and swelling noted.

## 2019-09-20 NOTE — ED Provider Notes (Signed)
[facility] Provider Note   CSN: [id number] Arrival date & time: 09/20/19  1703     History Chief Complaint  Patient presents with  . Toe Pain    [name] is a 39 y.o. female.  Presented to ER with complaint of right great toe pain.  States yesterday afternoon she noted some pain, bruising and swelling.  Does not remember injuring this.  Denies any other injury or aches or pains.  Pain currently mild, worse with movement, has not taken anything for pain.  No alleviating factors.  HPI     Past Medical History:  Diagnosis Date  . Anemia     Patient Active Problem List   Diagnosis Date Noted  . LOSS OF WEIGHT 02/20/2008    Past Surgical History:  Procedure Laterality Date  . CESAREAN SECTION    . OVARIAN CYST REMOVAL       OB History    Gravida  3   Para  3   Term  3   Preterm      AB      Living  3     SAB      TAB      Ectopic      Multiple      Live Births              Family History  Problem Relation Age of Onset  . Hypertension Mother   . Diabetes Mother   . Hypertension Father   . Breast cancer Maternal Grandmother     Social History   Tobacco Use  . Smoking status: Current Every Day Smoker    Packs/day: 0.50    Types: Cigarettes, Cigars  . Smokeless tobacco: Never Used  Substance Use Topics  . Alcohol use: Yes    Comment: rarely  . Drug use: Yes    Frequency: 1.0 times per week    Types: Marijuana    Comment: twice a day    Home Medications Prior to Admission medications   Medication Sig Start Date End Date Taking? Authorizing Provider  ibuprofen (ADVIL,MOTRIN) 600 MG tablet Take 1 tablet (600 mg total) by mouth every 6 (six) hours as needed. 01/18/17   Joy, Shawn C, PA-C  oxyCODONE-acetaminophen (PERCOCET) 5-325 MG tablet Take 1 tablet by mouth every 4 (four) hours as needed for up to 3 days for severe pain. 09/20/19 09/23/19  Lucrezia Starch, MD  valACYclovir (VALTREX) 1000 MG  tablet Take 1 tablet (1,000 mg total) by mouth 3 (three) times daily. 12/23/18   Raylene Everts, MD    Allergies    Hydrocodone  Review of Systems   Review of Systems  Constitutional: Negative for chills and fever.  HENT: Negative for ear pain and sore throat.   Eyes: Negative for pain and visual disturbance.  Respiratory: Negative for cough and shortness of breath.   Cardiovascular: Negative for chest pain and palpitations.  Gastrointestinal: Negative for abdominal pain and vomiting.  Genitourinary: Negative for dysuria and hematuria.  Musculoskeletal: Negative for arthralgias and back pain.  Skin: Negative for color change and rash.  Neurological: Negative for seizures and syncope.  All other systems reviewed and are negative.   Physical Exam Updated Vital Signs BP 120/87 (BP Location: Right Arm)   Pulse 88   Temp 98.1 F (36.7 C) (Oral)   Resp 18   Ht 5\' 4"  (1.626 m)   Wt 65.8 kg   SpO2 100%   BMI 24.89 kg/m  Physical Exam Constitutional:      Appearance: Normal appearance.  HENT:     Head: Normocephalic and atraumatic.     Nose: Nose normal.     Mouth/Throat:     Mouth: Mucous membranes are moist.  Eyes:     Extraocular Movements: Extraocular movements intact.     Pupils: Pupils are equal, round, and reactive to light.  Cardiovascular:     Rate and Rhythm: Normal rate.     Pulses: Normal pulses.  Pulmonary:     Effort: Pulmonary effort is normal.     Breath sounds: Normal breath sounds.  Musculoskeletal:     Cervical back: Normal range of motion.     Comments: Right foot mild tenderness, ecchymosis noted over right great toe, no obvious deformity noted, no skin laceration  Skin:    General: Skin is warm and dry.     Capillary Refill: Capillary refill takes less than 2 seconds.  Neurological:     Mental Status: She is alert.  Psychiatric:        Mood and Affect: Mood normal.        Behavior: Behavior normal.     ED Results / Procedures /  Treatments   Labs (all labs ordered are listed, but only abnormal results are displayed) Labs Reviewed - No data to display  EKG None  Radiology DG Toe Great Left  Result Date: 09/20/2019 CLINICAL DATA:  Acute left great toe pain. Redness and swelling. Denies injury. EXAM: LEFT GREAT TOE COMPARISON:  None. FINDINGS: Transverse nondisplaced fracture through the great toe distal phalanx. No intra-articular extension. No bony destruction or osseous erosion. Proximal phalanx is intact. Generalized soft tissue edema. IMPRESSION: Nondisplaced fracture through the great toe distal phalanx. Electronically Signed   By: Narda Rutherford M.D.   On: 09/20/2019 18:22    Procedures Procedures (including critical care time)  Medications Ordered in ED Medications  naproxen (NAPROSYN) tablet 500 mg (500 mg Oral Given 09/20/19 1836)    ED Course  I have reviewed the triage vital signs and the nursing notes.  Pertinent labs & imaging results that were available during my care of the patient were reviewed by me and considered in my medical decision making (see chart for details).    MDM Rules/Calculators/A&P                      39 year old lady who presented to ER with toe pain.  X-ray concerning for acute fracture.  Nondisplaced, no laceration associated.  Closed fracture.  Neurovascularly intact.  Provided fracture shoe, recommended outpatient Ortho follow-up.    After the discussed management above, the patient was determined to be safe for discharge.  The patient was in agreement with this plan and all questions regarding their care were answered.  ED return precautions were discussed and the patient will return to the ED with any significant worsening of condition.   Final Clinical Impression(s) / ED Diagnoses Final diagnoses:  Closed nondisplaced fracture of distal phalanx of left great toe, initial encounter    Rx / DC Orders ED Discharge Orders         Ordered     oxyCODONE-acetaminophen (PERCOCET) 5-325 MG tablet  Every 4 hours PRN     09/20/19 1835           Milagros Loll, MD 09/21/19 1651

## 2019-09-25 ENCOUNTER — Encounter: Payer: Self-pay | Admitting: Family Medicine

## 2019-09-25 ENCOUNTER — Ambulatory Visit (INDEPENDENT_AMBULATORY_CARE_PROVIDER_SITE_OTHER): Payer: Self-pay | Admitting: Family Medicine

## 2019-09-25 ENCOUNTER — Other Ambulatory Visit: Payer: Self-pay

## 2019-09-25 VITALS — BP 132/76 | Ht 65.0 in | Wt 145.0 lb

## 2019-09-25 DIAGNOSIS — S92425D Nondisplaced fracture of distal phalanx of left great toe, subsequent encounter for fracture with routine healing: Secondary | ICD-10-CM | POA: Insufficient documentation

## 2019-09-25 DIAGNOSIS — S92425A Nondisplaced fracture of distal phalanx of left great toe, initial encounter for closed fracture: Secondary | ICD-10-CM

## 2019-09-25 MED ORDER — HYDROCODONE-ACETAMINOPHEN 5-325 MG PO TABS: 1.0000 | ORAL_TABLET | Freq: Three times a day (TID) | ORAL | 0 refills | Status: DC | PRN

## 2019-09-25 NOTE — Patient Instructions (Signed)
Nice to meet you Please try ice   Please use the duexis for mild to moderate pain.  Please use the norco for severe pain  Please use the post op shoe for 3-4 weeks and then wean out of it.  Please send me a message in MyChart with any questions or updates.  Please see me back in 4-6 weeks or as need if better.   --Dr. Jordan Likes

## 2019-09-25 NOTE — Progress Notes (Signed)
Medication Samples have been provided to the patient.  Drug name: Duexis       Strength: 800mg /26.6mg        Qty: 1 Box  LOT  Exp.Date: 07/2020  Dosing instructions: Take 1 tablet by mouth three (3) times a day.   The patient has been instructed regarding the correct time, dose, and frequency of taking this medication, including desired effects and most common side effects.   08/2020, MA 3:36 PM 09/25/2019

## 2019-09-25 NOTE — Progress Notes (Signed)
[name] - 39 y.o. female MRN [id number]  Date of birth: 04-Jan-1981  SUBJECTIVE:  Including CC & ROS.  Chief Complaint  Patient presents with  . Toe Pain    left Great Toe    [name] is a 39 y.o. female that is presenting with left great toe pain.  She is unsure of how the toe was injured.  She does not recollect injuring it or having any trauma to the toe.  It suddenly started becoming more painful.  She was seen in the emergency department on 4/17.  She is still having some pain with walking pain seems to be worse at night..  Independent review of the left great toe x-ray from 4/17 shows transverse nondisplaced great toe fracture of the distal phalanx.   Review of Systems See HPI   HISTORY: Past Medical, Surgical, Social, and Family History Reviewed & Updated per EMR.   Pertinent Historical Findings include:  Past Medical History:  Diagnosis Date  . Anemia     Past Surgical History:  Procedure Laterality Date  . CESAREAN SECTION    . OVARIAN CYST REMOVAL      Family History  Problem Relation Age of Onset  . Hypertension Mother   . Diabetes Mother   . Hypertension Father   . Breast cancer Maternal Grandmother     Social History   Socioeconomic History  . Marital status: Married    Spouse name: Not on file  . Number of children: Not on file  . Years of education: Not on file  . Highest education level: Not on file  Occupational History  . Not on file  Tobacco Use  . Smoking status: Current Every Day Smoker    Packs/day: 0.50    Types: Cigarettes, Cigars  . Smokeless tobacco: Never Used  Substance and Sexual Activity  . Alcohol use: Yes    Comment: rarely  . Drug use: Yes    Frequency: 1.0 times per week    Types: Marijuana    Comment: twice a day  . Sexual activity: Yes    Birth control/protection: None  Other Topics Concern  . Not on file  Social History Narrative  . Not on file   Social Determinants of Health   Financial Resource  Strain:   . Difficulty of Paying Living Expenses:   Food Insecurity:   . Worried About Charity fundraiser in the Last Year:   . Arboriculturist in the Last Year:   Transportation Needs:   . Film/video editor (Medical):   Marland Kitchen Lack of Transportation (Non-Medical):   Physical Activity:   . Days of Exercise per Week:   . Minutes of Exercise per Session:   Stress:   . Feeling of Stress :   Social Connections:   . Frequency of Communication with Friends and Family:   . Frequency of Social Gatherings with Friends and Family:   . Attends Religious Services:   . Active Member of Clubs or Organizations:   . Attends Archivist Meetings:   Marland Kitchen Marital Status:   Intimate Partner Violence:   . Fear of Current or Ex-Partner:   . Emotionally Abused:   Marland Kitchen Physically Abused:   . Sexually Abused:      PHYSICAL EXAM:  VS: BP 132/76   Ht 5\' 5"  (1.651 m)   Wt 145 lb (65.8 kg)   BMI 24.13 kg/m  Physical Exam Gen: NAD, alert, cooperative with exam, well-appearing MSK:  Left foot:  No significant swelling or ecchymosis over the great toe or the dorsum of the foot. Tenderness to palpation of the distal phalanx of the great toe. Limited flexion and extension of the great toe. Neurovascular intact     ASSESSMENT & PLAN:   Nondisplaced fracture of distal phalanx of left great toe, initial encounter for closed fracture Unsure of mechanism or date of injury. Transverse fracture on imaging.  - continue post op shoe  -Provided samples of Duexis. -Norco. -Counseled on supportive care. -Can follow-up in 4 to 6 weeks.  Could consider imaging.

## 2019-09-25 NOTE — Assessment & Plan Note (Signed)
Unsure of mechanism or date of injury. Transverse fracture on imaging.  - continue post op shoe  -Provided samples of Duexis. -Norco. -Counseled on supportive care. -Can follow-up in 4 to 6 weeks.  Could consider imaging.

## 2019-10-20 ENCOUNTER — Telehealth: Payer: Self-pay | Admitting: Family Medicine

## 2019-10-20 DIAGNOSIS — S92425A Nondisplaced fracture of distal phalanx of left great toe, initial encounter for closed fracture: Secondary | ICD-10-CM

## 2019-10-20 NOTE — Telephone Encounter (Signed)
Patient left a voicemail Friday afternoon asking if she can get an xray of her toe. She stated she cannot bend the toe and it is dark in color

## 2019-10-20 NOTE — Telephone Encounter (Signed)
Left VM for patient. If she calls back please have her speak with a nurse/CMA and inform her I have put in an xray and can give her a call when it is resulted.   If any questions then please take the best time and phone number to call and I will try to call her back.   Myra Rude, MD [facility] 10/20/2019, 10:31 AM

## 2019-10-24 ENCOUNTER — Encounter (HOSPITAL_BASED_OUTPATIENT_CLINIC_OR_DEPARTMENT_OTHER): Payer: Self-pay

## 2019-10-24 ENCOUNTER — Emergency Department (HOSPITAL_BASED_OUTPATIENT_CLINIC_OR_DEPARTMENT_OTHER): Payer: Self-pay

## 2019-10-24 ENCOUNTER — Other Ambulatory Visit: Payer: Self-pay

## 2019-10-24 ENCOUNTER — Emergency Department (HOSPITAL_BASED_OUTPATIENT_CLINIC_OR_DEPARTMENT_OTHER)
Admission: EM | Admit: 2019-10-24 | Discharge: 2019-10-25 | Disposition: A | Discharge status: Home / Self Care | Payer: Self-pay | Attending: Emergency Medicine | Admitting: Emergency Medicine

## 2019-10-24 DIAGNOSIS — F17210 Nicotine dependence, cigarettes, uncomplicated: Secondary | ICD-10-CM | POA: Insufficient documentation

## 2019-10-24 DIAGNOSIS — W2209XA Striking against other stationary object, initial encounter: Secondary | ICD-10-CM | POA: Insufficient documentation

## 2019-10-24 DIAGNOSIS — S92425A Nondisplaced fracture of distal phalanx of left great toe, initial encounter for closed fracture: Secondary | ICD-10-CM | POA: Insufficient documentation

## 2019-10-24 DIAGNOSIS — Y9289 Other specified places as the place of occurrence of the external cause: Secondary | ICD-10-CM | POA: Insufficient documentation

## 2019-10-24 DIAGNOSIS — Y999 Unspecified external cause status: Secondary | ICD-10-CM | POA: Insufficient documentation

## 2019-10-24 DIAGNOSIS — Z79899 Other long term (current) drug therapy: Secondary | ICD-10-CM | POA: Insufficient documentation

## 2019-10-24 DIAGNOSIS — Y9389 Activity, other specified: Secondary | ICD-10-CM | POA: Insufficient documentation

## 2019-10-24 NOTE — ED Triage Notes (Addendum)
Pt c/o re injury to left great toe last week-first injury 4/16-pt wearing post op shoe-to triage in w/c

## 2019-10-25 MED ORDER — ACETAMINOPHEN 500 MG PO TABS
1000.0000 mg | ORAL_TABLET | Freq: Once | ORAL | Status: AC
  Administered 2019-10-25: 1000 mg via ORAL
  Filled 2019-10-25: qty 2

## 2019-10-25 NOTE — ED Provider Notes (Signed)
[facility] Provider Note  CSN: [id number] Arrival date & time: 10/24/19 2129  Chief Complaint(s) Toe Injury  HPI [name] is a 39 y.o. female with known nondisplaced fracture of the left great toe presents to the emergency department with persistent throbbing pain and worsened pain after hitting her toe on a piece of metal.  There is no lacerations or other injuries.  She wanted to see whether there was any new or worsening fracture.  Pain is worse with ambulation and palpation.  No real alleviating factors.  Patient reports that she is taken her hydrocodone and Tylenol for pain.  States that she only uses it once or twice a day.  She denies any other physical complaints.  HPI  Past Medical History Past Medical History:  Diagnosis Date  . Anemia    Patient Active Problem List   Diagnosis Date Noted  . Nondisplaced fracture of distal phalanx of left great toe, initial encounter for closed fracture 09/25/2019  . LOSS OF WEIGHT 02/20/2008   Home Medication(s) Prior to Admission medications   Medication Sig Start Date End Date Taking? Authorizing Provider  HYDROcodone-acetaminophen (NORCO/VICODIN) 5-325 MG tablet Take 1 tablet by mouth every 8 (eight) hours as needed. 09/25/19   Rosemarie Ax, MD  ibuprofen (ADVIL,MOTRIN) 600 MG tablet Take 1 tablet (600 mg total) by mouth every 6 (six) hours as needed. 01/18/17   Joy, Shawn C, PA-C  valACYclovir (VALTREX) 1000 MG tablet Take 1 tablet (1,000 mg total) by mouth 3 (three) times daily. 12/23/18   Raylene Everts, MD                                                                                                                                    Past Surgical History Past Surgical History:  Procedure Laterality Date  . CESAREAN SECTION    . OVARIAN CYST REMOVAL     Family History Family History  Problem Relation Age of Onset  . Hypertension Mother   . Diabetes Mother   . Hypertension Father     . Breast cancer Maternal Grandmother     Social History Social History   Tobacco Use  . Smoking status: Current Every Day Smoker    Packs/day: 0.50    Types: Cigarettes, Cigars  . Smokeless tobacco: Never Used  Substance Use Topics  . Alcohol use: Yes    Comment: rarely  . Drug use: Yes    Frequency: 1.0 times per week    Types: Marijuana    Comment: twice a day   Allergies Hydrocodone  Review of Systems Review of Systems All other systems are reviewed and are negative for acute change except as noted in the HPI  Physical Exam Vital Signs  I have reviewed the triage vital signs BP (!) 141/88 (BP Location: Right Arm)   Pulse 83   Temp 98.3 F (36.8 C) (Oral)   Resp 16  LMP 09/07/2019   SpO2 99%   Physical Exam Vitals reviewed.  Constitutional:      General: She is not in acute distress.    Appearance: She is well-developed. She is not diaphoretic.  HENT:     Head: Normocephalic and atraumatic.     Right Ear: External ear normal.     Left Ear: External ear normal.     Nose: Nose normal.  Eyes:     General: No scleral icterus.    Conjunctiva/sclera: Conjunctivae normal.  Neck:     Trachea: Phonation normal.  Cardiovascular:     Rate and Rhythm: Normal rate and regular rhythm.  Pulmonary:     Effort: Pulmonary effort is normal. No respiratory distress.     Breath sounds: No stridor.  Abdominal:     General: There is no distension.  Musculoskeletal:        General: Normal range of motion.     Cervical back: Normal range of motion.       Legs:  Neurological:     Mental Status: She is alert and oriented to person, place, and time.  Psychiatric:        Behavior: Behavior normal.     ED Results and Treatments Labs (all labs ordered are listed, but only abnormal results are displayed) Labs Reviewed - No data to display                                                                                                                       EKG  EKG  Interpretation  Date/Time:    Ventricular Rate:    PR Interval:    QRS Duration:   QT Interval:    QTC Calculation:   R Axis:     Text Interpretation:        Radiology DG Toe Great Left  Result Date: 10/24/2019 CLINICAL DATA:  Left great toe fracture from 04/16. Metal chair fell on great toe 1 week ago with increased pain and bruising. EXAM: LEFT GREAT TOE COMPARISON:  Radiograph 09/20/2019 FINDINGS: The great toe distal phalanx fracture lines are still readily visible without significant surrounding callus formation or healing. No significant change in alignment. Fracture line to the interphalangeal joint articular surface was not seen on prior exam. Fracture extends into the interphalangeal joint. IMPRESSION: The great toe distal phalanx fracture lines are still readily visible without significant surrounding callus formation or healing. There is now fracture extension to the articular surface of the interphalangeal joint that was not seen on prior exam and is likely new. Electronically Signed   By: Narda Rutherford M.D.   On: 10/24/2019 22:01    Pertinent labs & imaging results that were available during my care of the patient were reviewed by me and considered in my medical decision making (see chart for details).  Medications Ordered in ED Medications  acetaminophen (TYLENOL) tablet 1,000 mg (has no administration in time range)  Procedures Procedures  (including critical care time)  Medical Decision Making / ED Course I have reviewed the nursing notes for this encounter and the patient's prior records (if available in EHR or on provided paperwork).   [name] was evaluated in Emergency Department on 10/25/2019 for the symptoms described in the history of present illness. She was evaluated in the context of the global COVID-19 pandemic, which  necessitated consideration that the patient might be at risk for infection with the SARS-CoV-2 virus that causes COVID-19. Institutional protocols and algorithms that pertain to the evaluation of patients at risk for COVID-19 are in a state of rapid change based on information released by regulatory bodies including the CDC and federal and state organizations. These policies and algorithms were followed during the patient's care in the ED.  Plain film without significant change in the fracture.  No new fracture.  Patient already in a postop shoe.  Provided with Tylenol in the emergency department.  Patient already has follow-up with Dr. Elinor Parkinson.      Final Clinical Impression(s) / ED Diagnoses Final diagnoses:  Nondisplaced fracture of distal phalanx of left great toe, initial encounter for closed fracture   The patient appears reasonably screened and/or stabilized for discharge and I doubt any other medical condition or other [facility] requiring further screening, evaluation, or treatment in the ED at this time prior to discharge. Safe for discharge with strict return precautions.  Disposition: Discharge  Condition: Good  I have discussed the results, Dx and Tx plan with the patient/family who expressed understanding and agree(s) with the plan. Discharge instructions discussed at length. The patient/family was given strict return precautions who verbalized understanding of the instructions. No further questions at time of discharge.    ED Discharge Orders    None       Follow Up: Myra Rude, MD [address] [place] [address] [phone]   as scheduled      This chart was dictated using voice recognition software.  Despite best efforts to proofread,  errors can occur which can change the documentation meaning.   Nira Conn, MD 10/25/19 [phone]

## 2019-10-30 ENCOUNTER — Other Ambulatory Visit: Payer: Self-pay

## 2019-10-30 ENCOUNTER — Ambulatory Visit (INDEPENDENT_AMBULATORY_CARE_PROVIDER_SITE_OTHER): Payer: Self-pay | Admitting: Family Medicine

## 2019-10-30 ENCOUNTER — Encounter: Payer: Self-pay | Admitting: Family Medicine

## 2019-10-30 VITALS — BP 144/90 | HR 79 | Ht 64.0 in | Wt 145.0 lb

## 2019-10-30 DIAGNOSIS — S92425D Nondisplaced fracture of distal phalanx of left great toe, subsequent encounter for fracture with routine healing: Secondary | ICD-10-CM

## 2019-10-30 NOTE — Patient Instructions (Signed)
Good to see you Please continue the post op shoe.  You can try weaning out of the shoe over the next 2-3 weeks. If you don't have pain then try buddy taping the toes together.   Please try ice and elevation  Please send me a message in MyChart with any questions or updates.  Please see me back in 4 weeks.   --Dr. Jordan Likes

## 2019-10-30 NOTE — Assessment & Plan Note (Signed)
Exacerbated her pain on 5/21 where she hit a chair.  New imaging suggested an intra-articular involvement but still nondisplaced. -Can continue to postop shoe slowly try to wean out of it as her pain tolerates over the next few weeks. -Counseled on buddy taping. -Follow-up in 4 weeks.  Would reimage at that time.

## 2019-10-30 NOTE — Progress Notes (Signed)
[name] - 39 y.o. female MRN [id number]  Date of birth: 10-29-1980  SUBJECTIVE:  Including CC & ROS.  Chief Complaint  Patient presents with  . Follow-up    left Great toe    [name] is a 39 y.o. female that is following up for her left great toe fracture.  She recently hit it on a chair and was seen in the emergency department.  Repeat imaging showed a possible new extension into the articular surface.  It still remains nondisplaced.  Pain has improved and swelling has improved as well as the ecchymosis.  Still using the postop shoe.  Independent review of the left great toe x-ray from 5/21 shows an extension into the interphalangeal joint which is likely new.   Review of Systems See HPI   HISTORY: Past Medical, Surgical, Social, and Family History Reviewed & Updated per EMR.   Pertinent Historical Findings include:  Past Medical History:  Diagnosis Date  . Anemia     Past Surgical History:  Procedure Laterality Date  . CESAREAN SECTION    . OVARIAN CYST REMOVAL      Family History  Problem Relation Age of Onset  . Hypertension Mother   . Diabetes Mother   . Hypertension Father   . Breast cancer Maternal Grandmother     Social History   Socioeconomic History  . Marital status: Married    Spouse name: Not on file  . Number of children: Not on file  . Years of education: Not on file  . Highest education level: Not on file  Occupational History  . Not on file  Tobacco Use  . Smoking status: Current Every Day Smoker    Packs/day: 0.50    Types: Cigarettes, Cigars  . Smokeless tobacco: Never Used  Substance and Sexual Activity  . Alcohol use: Yes    Comment: rarely  . Drug use: Yes    Frequency: 1.0 times per week    Types: Marijuana    Comment: twice a day  . Sexual activity: Not on file  Other Topics Concern  . Not on file  Social History Narrative  . Not on file   Social Determinants of Health   Financial Resource Strain:   .  Difficulty of Paying Living Expenses:   Food Insecurity:   . Worried About Programme researcher, broadcasting/film/video in the Last Year:   . Barista in the Last Year:   Transportation Needs:   . Freight forwarder (Medical):   Marland Kitchen Lack of Transportation (Non-Medical):   Physical Activity:   . Days of Exercise per Week:   . Minutes of Exercise per Session:   Stress:   . Feeling of Stress :   Social Connections:   . Frequency of Communication with Friends and Family:   . Frequency of Social Gatherings with Friends and Family:   . Attends Religious Services:   . Active Member of Clubs or Organizations:   . Attends Banker Meetings:   Marland Kitchen Marital Status:   Intimate Partner Violence:   . Fear of Current or Ex-Partner:   . Emotionally Abused:   Marland Kitchen Physically Abused:   . Sexually Abused:      PHYSICAL EXAM:  VS: BP (!) 144/90   Pulse 79   Ht 5\' 4"  (1.626 m)   Wt 145 lb (65.8 kg)   BMI 24.89 kg/m  Physical Exam Gen: NAD, alert, cooperative with exam, well-appearing MSK:  Left foot: No significant swelling  or ecchymosis of the great toe. Tenderness to palpation over the distal phalanx. Limited flexion extension of the distal phalanx. Normal flexion and extension of the MTP joint. Neurovascular intact     ASSESSMENT & PLAN:   Nondisplaced fracture of distal phalanx of left great toe, subsequent encounter for fracture with routine healing Exacerbated her pain on 5/21 where she hit a chair.  New imaging suggested an intra-articular involvement but still nondisplaced. -Can continue to postop shoe slowly try to wean out of it as her pain tolerates over the next few weeks. -Counseled on buddy taping. -Follow-up in 4 weeks.  Would reimage at that time.

## 2019-11-27 ENCOUNTER — Ambulatory Visit: Payer: Self-pay | Admitting: Family Medicine

## 2019-12-01 ENCOUNTER — Ambulatory Visit: Payer: Self-pay | Admitting: Family Medicine

## 2019-12-01 NOTE — Progress Notes (Deleted)
  [name] - 39 y.o. female MRN [id number]  Date of birth: [birth year]  SUBJECTIVE:  Including CC & ROS.  No chief complaint on file.   [name] is a 39 y.o. female that is  ***.  ***   Review of Systems See HPI   HISTORY: Past Medical, Surgical, Social, and Family History Reviewed & Updated per EMR.   Pertinent Historical Findings include:  Past Medical History:  Diagnosis Date  . Anemia     Past Surgical History:  Procedure Laterality Date  . CESAREAN SECTION    . OVARIAN CYST REMOVAL      Family History  Problem Relation Age of Onset  . Hypertension Mother   . Diabetes Mother   . Hypertension Father   . Breast cancer Maternal Grandmother     Social History   Socioeconomic History  . Marital status: Married    Spouse name: Not on file  . Number of children: Not on file  . Years of education: Not on file  . Highest education level: Not on file  Occupational History  . Not on file  Tobacco Use  . Smoking status: Current Every Day Smoker    Packs/day: 0.50    Types: Cigarettes, Cigars  . Smokeless tobacco: Never Used  Vaping Use  . Vaping Use: Never used  Substance and Sexual Activity  . Alcohol use: Yes    Comment: rarely  . Drug use: Yes    Frequency: 1.0 times per week    Types: Marijuana    Comment: twice a day  . Sexual activity: Not on file  Other Topics Concern  . Not on file  Social History Narrative  . Not on file   Social Determinants of Health   Financial Resource Strain:   . Difficulty of Paying Living Expenses:   Food Insecurity:   . Worried About Programme researcher, broadcasting/film/video in the Last Year:   . Barista in the Last Year:   Transportation Needs:   . Freight forwarder (Medical):   Marland Kitchen Lack of Transportation (Non-Medical):   Physical Activity:   . Days of Exercise per Week:   . Minutes of Exercise per Session:   Stress:   . Feeling of Stress :   Social Connections:   . Frequency of Communication with Friends and  Family:   . Frequency of Social Gatherings with Friends and Family:   . Attends Religious Services:   . Active Member of Clubs or Organizations:   . Attends Banker Meetings:   Marland Kitchen Marital Status:   Intimate Partner Violence:   . Fear of Current or Ex-Partner:   . Emotionally Abused:   Marland Kitchen Physically Abused:   . Sexually Abused:      PHYSICAL EXAM:  VS: There were no vitals taken for this visit. Physical Exam Gen: NAD, alert, cooperative with exam, well-appearing MSK:  ***      ASSESSMENT & PLAN:   No problem-specific Assessment & Plan notes found for this encounter.

## 2020-07-23 ENCOUNTER — Emergency Department (HOSPITAL_BASED_OUTPATIENT_CLINIC_OR_DEPARTMENT_OTHER): Payer: Self-pay

## 2020-07-23 ENCOUNTER — Emergency Department (HOSPITAL_BASED_OUTPATIENT_CLINIC_OR_DEPARTMENT_OTHER)
Admission: EM | Admit: 2020-07-23 | Discharge: 2020-07-23 | Disposition: A | Discharge status: Home / Self Care | Payer: Self-pay | Attending: Emergency Medicine | Admitting: Emergency Medicine

## 2020-07-23 ENCOUNTER — Encounter (HOSPITAL_BASED_OUTPATIENT_CLINIC_OR_DEPARTMENT_OTHER): Payer: Self-pay | Admitting: *Deleted

## 2020-07-23 ENCOUNTER — Other Ambulatory Visit: Payer: Self-pay

## 2020-07-23 DIAGNOSIS — S0083XA Contusion of other part of head, initial encounter: Secondary | ICD-10-CM

## 2020-07-23 DIAGNOSIS — S39012A Strain of muscle, fascia and tendon of lower back, initial encounter: Secondary | ICD-10-CM | POA: Insufficient documentation

## 2020-07-23 DIAGNOSIS — Z23 Encounter for immunization: Secondary | ICD-10-CM | POA: Insufficient documentation

## 2020-07-23 DIAGNOSIS — S2232XA Fracture of one rib, left side, initial encounter for closed fracture: Secondary | ICD-10-CM | POA: Insufficient documentation

## 2020-07-23 DIAGNOSIS — S0181XA Laceration without foreign body of other part of head, initial encounter: Secondary | ICD-10-CM

## 2020-07-23 DIAGNOSIS — S01411A Laceration without foreign body of right cheek and temporomandibular area, initial encounter: Secondary | ICD-10-CM | POA: Insufficient documentation

## 2020-07-23 DIAGNOSIS — M546 Pain in thoracic spine: Secondary | ICD-10-CM | POA: Insufficient documentation

## 2020-07-23 DIAGNOSIS — F17210 Nicotine dependence, cigarettes, uncomplicated: Secondary | ICD-10-CM | POA: Insufficient documentation

## 2020-07-23 LAB — PREGNANCY, URINE: Preg Test, Ur: NEGATIVE

## 2020-07-23 MED ORDER — TETANUS-DIPHTH-ACELL PERTUSSIS 5-2.5-18.5 LF-MCG/0.5 IM SUSY
0.5000 mL | PREFILLED_SYRINGE | Freq: Once | INTRAMUSCULAR | Status: AC
  Administered 2020-07-23: 0.5 mL via INTRAMUSCULAR
  Filled 2020-07-23: qty 0.5

## 2020-07-23 MED ORDER — HYDROCODONE-ACETAMINOPHEN 5-325 MG PO TABS: 2.0000 | ORAL_TABLET | ORAL | 0 refills | Status: DC | PRN

## 2020-07-23 MED ORDER — OXYCODONE-ACETAMINOPHEN 5-325 MG PO TABS
2.0000 | ORAL_TABLET | Freq: Once | ORAL | Status: AC
  Administered 2020-07-23: 2 via ORAL
  Filled 2020-07-23: qty 2
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
## 2020-07-31 ENCOUNTER — Emergency Department (HOSPITAL_BASED_OUTPATIENT_CLINIC_OR_DEPARTMENT_OTHER): Payer: Self-pay

## 2020-07-31 ENCOUNTER — Emergency Department (HOSPITAL_BASED_OUTPATIENT_CLINIC_OR_DEPARTMENT_OTHER)
Admission: EM | Admit: 2020-07-31 | Discharge: 2020-07-31 | Disposition: A | Discharge status: Home / Self Care | Payer: Self-pay | Attending: Emergency Medicine | Admitting: Emergency Medicine

## 2020-07-31 ENCOUNTER — Encounter (HOSPITAL_BASED_OUTPATIENT_CLINIC_OR_DEPARTMENT_OTHER): Payer: Self-pay | Admitting: Emergency Medicine

## 2020-07-31 ENCOUNTER — Other Ambulatory Visit: Payer: Self-pay

## 2020-07-31 DIAGNOSIS — R0789 Other chest pain: Secondary | ICD-10-CM | POA: Insufficient documentation

## 2020-07-31 DIAGNOSIS — F17210 Nicotine dependence, cigarettes, uncomplicated: Secondary | ICD-10-CM | POA: Insufficient documentation

## 2020-07-31 MED ORDER — ACETAMINOPHEN 500 MG PO TABS
1000.0000 mg | ORAL_TABLET | Freq: Once | ORAL | Status: AC
  Administered 2020-07-31: 1000 mg via ORAL
  Filled 2020-07-31: qty 2

## 2020-07-31 MED ORDER — IBUPROFEN 800 MG PO TABS
800.0000 mg | ORAL_TABLET | Freq: Once | ORAL | Status: AC
  Administered 2020-07-31: 800 mg via ORAL
  Filled 2020-07-31: qty 1
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
## 2020-07-31 NOTE — Discharge Instructions (Addendum)
Follow up with your family doc.  Use the pillow for support, continue to use the incentive spirometer. Return for fever, sob.

## 2021-02-04 ENCOUNTER — Emergency Department (HOSPITAL_BASED_OUTPATIENT_CLINIC_OR_DEPARTMENT_OTHER)
Admission: EM | Admit: 2021-02-04 | Discharge: 2021-02-04 | Disposition: A | Discharge status: Home / Self Care | Payer: Self-pay | Attending: Emergency Medicine | Admitting: Emergency Medicine

## 2021-02-04 ENCOUNTER — Encounter (HOSPITAL_BASED_OUTPATIENT_CLINIC_OR_DEPARTMENT_OTHER): Payer: Self-pay

## 2021-02-04 ENCOUNTER — Emergency Department (HOSPITAL_BASED_OUTPATIENT_CLINIC_OR_DEPARTMENT_OTHER): Payer: Self-pay

## 2021-02-04 ENCOUNTER — Other Ambulatory Visit: Payer: Self-pay

## 2021-02-04 DIAGNOSIS — S2242XA Multiple fractures of ribs, left side, initial encounter for closed fracture: Secondary | ICD-10-CM | POA: Insufficient documentation

## 2021-02-04 DIAGNOSIS — S3991XA Unspecified injury of abdomen, initial encounter: Secondary | ICD-10-CM | POA: Insufficient documentation

## 2021-02-04 DIAGNOSIS — N644 Mastodynia: Secondary | ICD-10-CM | POA: Insufficient documentation

## 2021-02-04 DIAGNOSIS — F17210 Nicotine dependence, cigarettes, uncomplicated: Secondary | ICD-10-CM | POA: Insufficient documentation

## 2021-02-04 LAB — CBC WITH DIFFERENTIAL/PLATELET
Abs Immature Granulocytes: 0.01 10*3/uL (ref 0.00–0.07)
Basophils Absolute: 0 10*3/uL (ref 0.0–0.1)
Basophils Relative: 1 %
Eosinophils Absolute: 0.1 10*3/uL (ref 0.0–0.5)
Eosinophils Relative: 1 %
HCT: 27.4 % — ABNORMAL LOW (ref 36.0–46.0)
Hemoglobin: 8 g/dL — ABNORMAL LOW (ref 12.0–15.0)
Immature Granulocytes: 0 %
Lymphocytes Relative: 11 %
Lymphs Abs: 0.6 10*3/uL — ABNORMAL LOW (ref 0.7–4.0)
MCH: 18.7 pg — ABNORMAL LOW (ref 26.0–34.0)
MCHC: 29.2 g/dL — ABNORMAL LOW (ref 30.0–36.0)
MCV: 64 fL — ABNORMAL LOW (ref 80.0–100.0)
Monocytes Absolute: 0.7 10*3/uL (ref 0.1–1.0)
Monocytes Relative: 14 %
Neutro Abs: 4.1 10*3/uL (ref 1.7–7.7)
Neutrophils Relative %: 73 %
Platelets: 461 10*3/uL — ABNORMAL HIGH (ref 150–400)
RBC: 4.28 MIL/uL (ref 3.87–5.11)
RDW: 20.5 % — ABNORMAL HIGH (ref 11.5–15.5)
WBC: 5.5 10*3/uL (ref 4.0–10.5)
nRBC: 0 % (ref 0.0–0.2)

## 2021-02-04 LAB — COMPREHENSIVE METABOLIC PANEL
ALT: 13 U/L (ref 0–44)
AST: 18 U/L (ref 15–41)
Albumin: 3.4 g/dL — ABNORMAL LOW (ref 3.5–5.0)
Alkaline Phosphatase: 44 U/L (ref 38–126)
Anion gap: 6 (ref 5–15)
BUN: 13 mg/dL (ref 6–20)
CO2: 24 mmol/L (ref 22–32)
Calcium: 8.7 mg/dL — ABNORMAL LOW (ref 8.9–10.3)
Chloride: 108 mmol/L (ref 98–111)
Creatinine, Ser: 0.69 mg/dL (ref 0.44–1.00)
GFR, Estimated: >60 mL/min (ref >60)
Glucose, Bld: 91 mg/dL (ref 70–99)
Potassium: 3.4 mmol/L — ABNORMAL LOW (ref 3.5–5.1)
Sodium: 138 mmol/L (ref 135–145)
Total Bilirubin: 0.3 mg/dL (ref 0.3–1.2)
Total Protein: 6.8 g/dL (ref 6.5–8.1)

## 2021-02-04 LAB — PREGNANCY, URINE: Preg Test, Ur: NEGATIVE

## 2021-02-04 MED ORDER — ACETAMINOPHEN 325 MG PO TABS
650.0000 mg | ORAL_TABLET | Freq: Once | ORAL | Status: AC
  Administered 2021-02-04: 650 mg via ORAL
  Filled 2021-02-04: qty 2

## 2021-02-04 MED ORDER — HYDROCODONE-ACETAMINOPHEN 5-325 MG PO TABS: 1.0000 | ORAL_TABLET | Freq: Four times a day (QID) | ORAL | 0 refills | Status: DC | PRN

## 2021-02-04 MED ORDER — IOHEXOL 350 MG/ML SOLN
100.0000 mL | Freq: Once | INTRAVENOUS | Status: AC | PRN
  Administered 2021-02-04: 85 mL via INTRAVENOUS

## 2021-02-04 NOTE — Discharge Instructions (Addendum)
You were seen in the emergency department today for rib fractures.  As we discussed, it is unclear on imaging whether these are old or new fractures.  However, we will treat them the same.  Please use your incentive spirometer multiple times per day to prevent pneumonia.  Norco as needed for pain.  Please return the emergency department for worsening pain, new shortness of breath, fever, cough, or any other concerns you may have.
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
## 2021-02-24 ENCOUNTER — Other Ambulatory Visit: Payer: Self-pay

## 2021-02-24 ENCOUNTER — Encounter (HOSPITAL_BASED_OUTPATIENT_CLINIC_OR_DEPARTMENT_OTHER): Payer: Self-pay | Admitting: Emergency Medicine

## 2021-02-24 ENCOUNTER — Emergency Department (HOSPITAL_BASED_OUTPATIENT_CLINIC_OR_DEPARTMENT_OTHER)
Admission: EM | Admit: 2021-02-24 | Discharge: 2021-02-24 | Disposition: A | Discharge status: Home / Self Care | Payer: Self-pay | Attending: Emergency Medicine | Admitting: Emergency Medicine

## 2021-02-24 ENCOUNTER — Emergency Department (HOSPITAL_BASED_OUTPATIENT_CLINIC_OR_DEPARTMENT_OTHER): Payer: Self-pay

## 2021-02-24 DIAGNOSIS — F17210 Nicotine dependence, cigarettes, uncomplicated: Secondary | ICD-10-CM | POA: Insufficient documentation

## 2021-02-24 DIAGNOSIS — R0781 Pleurodynia: Secondary | ICD-10-CM | POA: Insufficient documentation

## 2021-02-24 DIAGNOSIS — R059 Cough, unspecified: Secondary | ICD-10-CM | POA: Insufficient documentation

## 2021-02-24 LAB — PREGNANCY, URINE: Preg Test, Ur: NEGATIVE

## 2021-02-24 MED ORDER — LIDOCAINE 5 % EX PTCH: 1.0000 | MEDICATED_PATCH | CUTANEOUS | 0 refills | Status: AC

## 2021-02-24 MED ORDER — GABAPENTIN 300 MG PO CAPS: 300.0000 mg | ORAL_CAPSULE | Freq: Three times a day (TID) | ORAL | 0 refills | Status: DC | PRN
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
## 2021-03-09 ENCOUNTER — Encounter (HOSPITAL_COMMUNITY): Payer: Self-pay | Admitting: Radiology

## 2021-06-01 ENCOUNTER — Encounter (HOSPITAL_BASED_OUTPATIENT_CLINIC_OR_DEPARTMENT_OTHER): Payer: Self-pay

## 2021-06-01 ENCOUNTER — Other Ambulatory Visit: Payer: Self-pay

## 2021-06-01 ENCOUNTER — Emergency Department (HOSPITAL_BASED_OUTPATIENT_CLINIC_OR_DEPARTMENT_OTHER)
Admission: EM | Admit: 2021-06-01 | Discharge: 2021-06-01 | Disposition: A | Discharge status: Home / Self Care | Payer: Self-pay | Attending: Emergency Medicine | Admitting: Emergency Medicine

## 2021-06-01 DIAGNOSIS — F17210 Nicotine dependence, cigarettes, uncomplicated: Secondary | ICD-10-CM | POA: Diagnosis not present

## 2021-06-01 DIAGNOSIS — M791 Myalgia, unspecified site: Secondary | ICD-10-CM | POA: Insufficient documentation

## 2021-06-01 DIAGNOSIS — M545 Low back pain, unspecified: Secondary | ICD-10-CM | POA: Diagnosis present

## 2021-06-01 DIAGNOSIS — Y92410 Unspecified street and highway as the place of occurrence of the external cause: Secondary | ICD-10-CM | POA: Diagnosis not present

## 2021-06-01 DIAGNOSIS — M79631 Pain in right forearm: Secondary | ICD-10-CM | POA: Insufficient documentation

## 2021-06-01 DIAGNOSIS — M7918 Myalgia, other site: Secondary | ICD-10-CM

## 2021-06-01 MED ORDER — NAPROXEN 500 MG PO TABS: 500.0000 mg | ORAL_TABLET | Freq: Two times a day (BID) | ORAL | 0 refills | Status: DC

## 2021-06-01 MED ORDER — CYCLOBENZAPRINE HCL 10 MG PO TABS: 10.0000 mg | ORAL_TABLET | Freq: Two times a day (BID) | ORAL | 0 refills | Status: DC | PRN

## 2021-06-01 NOTE — Discharge Instructions (Addendum)
Take Flexeril as needed for muscle spasms, do not drive if taking Flexeril. Take Naproxen as prescribed as needed for pain. Warm compresses to sore muscles for 20 minutes at a time. Gentle stretching. Follow up with your PCP if symptoms are not improving after 3-5 days.
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
## 2021-09-12 ENCOUNTER — Other Ambulatory Visit: Payer: Self-pay

## 2021-09-12 ENCOUNTER — Encounter (HOSPITAL_BASED_OUTPATIENT_CLINIC_OR_DEPARTMENT_OTHER): Payer: Self-pay | Admitting: Emergency Medicine

## 2021-09-12 ENCOUNTER — Emergency Department (HOSPITAL_BASED_OUTPATIENT_CLINIC_OR_DEPARTMENT_OTHER)
Admission: EM | Admit: 2021-09-12 | Discharge: 2021-09-12 | Disposition: A | Discharge status: Home / Self Care | Payer: Self-pay | Attending: Emergency Medicine | Admitting: Emergency Medicine

## 2021-09-12 ENCOUNTER — Emergency Department (HOSPITAL_BASED_OUTPATIENT_CLINIC_OR_DEPARTMENT_OTHER): Payer: Self-pay

## 2021-09-12 DIAGNOSIS — D649 Anemia, unspecified: Secondary | ICD-10-CM | POA: Insufficient documentation

## 2021-09-12 DIAGNOSIS — S2232XA Fracture of one rib, left side, initial encounter for closed fracture: Secondary | ICD-10-CM | POA: Insufficient documentation

## 2021-09-12 DIAGNOSIS — W228XXA Striking against or struck by other objects, initial encounter: Secondary | ICD-10-CM | POA: Insufficient documentation

## 2021-09-12 MED ORDER — LIDOCAINE 4 % EX PTCH: 1.0000 | MEDICATED_PATCH | Freq: Two times a day (BID) | CUTANEOUS | 0 refills | Status: DC | PRN

## 2021-09-12 MED ORDER — OXYCODONE-ACETAMINOPHEN 5-325 MG PO TABS: 1.0000 | ORAL_TABLET | Freq: Four times a day (QID) | ORAL | 0 refills | Status: AC | PRN

## 2021-09-12 MED ORDER — LIDOCAINE 5 % EX PTCH
1.0000 | MEDICATED_PATCH | CUTANEOUS | Status: DC
  Administered 2021-09-12: 1 via TRANSDERMAL
  Filled 2021-09-12: qty 1
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
## 2021-09-12 NOTE — Discharge Instructions (Signed)
You came to the emergency department today to be evaluated for your left-sided chest wall pain.  The x-ray obtained shows that you have a nondisplaced left rib fracture.  Please use the incentive spirometer as we discussed.  Please use prescribed pain medication and lidocaine patches as prescribed. ? ?You are being prescribed a medication which may make you sleepy or mpair your ability to make decisions.  For 24 hours after taking this medication please do not drive, operate heavy machinery, care for a small child with out another adult present, or perform any activities that may cause harm to you or someone else if you were to fall asleep or be impaired.   ? ?Get help right away if: ?You have difficulty breathing or you are short of breath. ?You develop a cough that does not stop, or you cough up thick or bloody sputum. ?You have nausea, vomiting, or pain in your abdomen. ?Your pain gets worse and medicine does not help. ?

## 2022-01-23 ENCOUNTER — Emergency Department (HOSPITAL_BASED_OUTPATIENT_CLINIC_OR_DEPARTMENT_OTHER)
Admission: EM | Admit: 2022-01-23 | Discharge: 2022-01-23 | Disposition: A | Discharge status: Home / Self Care | Payer: Self-pay | Attending: Emergency Medicine | Admitting: Emergency Medicine

## 2022-01-23 ENCOUNTER — Other Ambulatory Visit: Payer: Self-pay

## 2022-01-23 DIAGNOSIS — R55 Syncope and collapse: Secondary | ICD-10-CM | POA: Insufficient documentation

## 2022-01-23 DIAGNOSIS — R531 Weakness: Secondary | ICD-10-CM | POA: Insufficient documentation

## 2022-01-23 LAB — BASIC METABOLIC PANEL
Anion gap: 6 (ref 5–15)
BUN: 15 mg/dL (ref 6–20)
CO2: 24 mmol/L (ref 22–32)
Calcium: 8.7 mg/dL — ABNORMAL LOW (ref 8.9–10.3)
Chloride: 107 mmol/L (ref 98–111)
Creatinine, Ser: 0.83 mg/dL (ref 0.44–1.00)
GFR, Estimated: >60 mL/min (ref >60)
Glucose, Bld: 92 mg/dL (ref 70–99)
Potassium: 3.8 mmol/L (ref 3.5–5.1)
Sodium: 137 mmol/L (ref 135–145)

## 2022-01-23 LAB — CBC WITH DIFFERENTIAL/PLATELET
Abs Immature Granulocytes: 0.01 10*3/uL (ref 0.00–0.07)
Basophils Absolute: 0.1 10*3/uL (ref 0.0–0.1)
Basophils Relative: 1 %
Eosinophils Absolute: 0.1 10*3/uL (ref 0.0–0.5)
Eosinophils Relative: 1 %
HCT: 27 % — ABNORMAL LOW (ref 36.0–46.0)
Hemoglobin: 7.6 g/dL — ABNORMAL LOW (ref 12.0–15.0)
Immature Granulocytes: 0 %
Lymphocytes Relative: 11 %
Lymphs Abs: 0.7 10*3/uL (ref 0.7–4.0)
MCH: 16.6 pg — ABNORMAL LOW (ref 26.0–34.0)
MCHC: 28.1 g/dL — ABNORMAL LOW (ref 30.0–36.0)
MCV: 59.1 fL — ABNORMAL LOW (ref 80.0–100.0)
Monocytes Absolute: 0.5 10*3/uL (ref 0.1–1.0)
Monocytes Relative: 9 %
Neutro Abs: 4.7 10*3/uL (ref 1.7–7.7)
Neutrophils Relative %: 78 %
Platelets: 426 10*3/uL — ABNORMAL HIGH (ref 150–400)
RBC: 4.57 MIL/uL (ref 3.87–5.11)
RDW: 20.6 % — ABNORMAL HIGH (ref 11.5–15.5)
WBC: 6 10*3/uL (ref 4.0–10.5)
nRBC: 0 % (ref 0.0–0.2)

## 2022-01-23 LAB — PREGNANCY, URINE: Preg Test, Ur: NEGATIVE

## 2022-01-23 MED ORDER — SODIUM CHLORIDE 0.9 % IV BOLUS
1000.0000 mL | Freq: Once | INTRAVENOUS | Status: AC
  Administered 2022-01-23: 1000 mL via INTRAVENOUS

## 2022-01-23 NOTE — ED Provider Notes (Signed)
[facility] EMERGENCY DEPARTMENT Provider Note   CSN: [id number] Arrival date & time: 01/23/22  0957     History PMH: Anemia Chief Complaint  Patient presents with   Loss of Consciousness    [name] is a 41 y.o. female.  Patient presents with syncope.  He states that yesterday she got out of the hot shower and suddenly felt woozy and dizzy and reportedly passed out.  This was witnessed by her female partner.  She did not hit her head.  She denies any injury sustained in this.  She says she got up and she was still feeling woozy and walked to the bed and then she passed out again onto the bed.  Ever since then, she just felt very dizzy and lightheaded.  Associated nausea.  She has not had any further episodes of syncope though.  Does report that she is only drink ice chips for the past several days.  He has had episodes of syncope in the past due to heat.  Denies any other associated symptoms such as chest pain, shortness of breath, abdominal pain, pelvic pain, vaginal discharge, abnormal vaginal bleeding, numbness or weakness, headache.   Loss of Consciousness Associated symptoms: dizziness, nausea and weakness   Associated symptoms: no chest pain, no fever, no shortness of breath and no vomiting        Home Medications Prior to Admission medications   Medication Sig Start Date End Date Taking? Authorizing Provider  cyclobenzaprine (FLEXERIL) 10 MG tablet Take 1 tablet (10 mg total) by mouth 2 (two) times daily as needed for muscle spasms. 06/01/21   Jeannie Fend, PA-C  gabapentin (NEURONTIN) 300 MG capsule Take 1 capsule (300 mg total) by mouth 3 (three) times daily as needed for up to 30 doses. 02/24/21   Terald Sleeper, MD  Lidocaine (HM LIDOCAINE PATCH) 4 % PTCH Apply 1 patch topically every 12 (twelve) hours as needed. 09/12/21   Haskel Schroeder, PA-C  naproxen (NAPROSYN) 500 MG tablet Take 1 tablet (500 mg total) by mouth 2 (two) times daily. 06/01/21    Jeannie Fend, PA-C  valACYclovir (VALTREX) 1000 MG tablet Take 1 tablet (1,000 mg total) by mouth 3 (three) times daily. 12/23/18   Eustace Moore, MD      Allergies    Hydrocodone    Review of Systems   Review of Systems  Constitutional:  Negative for fever.  Respiratory:  Negative for cough and shortness of breath.   Cardiovascular:  Positive for syncope. Negative for chest pain.  Gastrointestinal:  Positive for nausea. Negative for abdominal pain and vomiting.  Genitourinary:  Negative for dysuria, flank pain, hematuria, pelvic pain and vaginal discharge.  Neurological:  Positive for dizziness, syncope, weakness and light-headedness.  All other systems reviewed and are negative.   Physical Exam Updated Vital Signs BP 132/80   Pulse (!) 59   Temp 98.3 F (36.8 C)   Resp 12   SpO2 100%  Physical Exam Vitals and nursing note reviewed.  Constitutional:      General: She is not in acute distress.    Appearance: Normal appearance. She is well-developed. She is not ill-appearing, toxic-appearing or diaphoretic.  HENT:     Head: Normocephalic and atraumatic.     Nose: No nasal deformity.     Mouth/Throat:     Lips: Pink. No lesions.  Eyes:     General: Gaze aligned appropriately. No scleral icterus.       Right  eye: No discharge.        Left eye: No discharge.     Conjunctiva/sclera: Conjunctivae normal.     Right eye: Right conjunctiva is not injected. No exudate or hemorrhage.    Left eye: Left conjunctiva is not injected. No exudate or hemorrhage. Cardiovascular:     Rate and Rhythm: Normal rate and regular rhythm.     Pulses: Normal pulses.  Pulmonary:     Effort: Pulmonary effort is normal. No respiratory distress.  Abdominal:     General: Abdomen is flat. There is no distension.     Palpations: Abdomen is soft.     Tenderness: There is no abdominal tenderness. There is no right CVA tenderness, left CVA tenderness, guarding or rebound.  Skin:    General:  Skin is warm and dry.  Neurological:     General: No focal deficit present.     Mental Status: She is alert and oriented to person, place, and time.  Psychiatric:        Mood and Affect: Mood normal.        Speech: Speech normal.        Behavior: Behavior normal. Behavior is cooperative.     ED Results / Procedures / Treatments   Labs (all labs ordered are listed, but only abnormal results are displayed) Labs Reviewed  CBC WITH DIFFERENTIAL/PLATELET - Abnormal; Notable for the following components:      Result Value   Hemoglobin 7.6 (*)    HCT 27.0 (*)    MCV 59.1 (*)    MCH 16.6 (*)    MCHC 28.1 (*)    RDW 20.6 (*)    Platelets 426 (*)    All other components within normal limits  BASIC METABOLIC PANEL - Abnormal; Notable for the following components:   Calcium 8.7 (*)    All other components within normal limits  PREGNANCY, URINE    EKG EKG Interpretation  Date/Time:  Monday January 23 2022 10:08:55 EDT Ventricular Rate:  68 PR Interval:  134 QRS Duration: 86 QT Interval:  375 QTC Calculation: 399 R Axis:   11 Text Interpretation: Sinus rhythm similar to prior no stemi Confirmed by Tanda Rockers ([id number]) on 01/23/2022 10:31:13 AM  Radiology No results found.  Procedures Procedures  This patient was on telemetry or cardiac monitoring during their time in the ED.    Medications Ordered in ED Medications  sodium chloride 0.9 % bolus 1,000 mL (1,000 mLs Intravenous New Bag/Given 01/23/22 1101)    ED Course/ Medical Decision Making/ A&P                           Medical Decision Making Amount and/or Complexity of Data Reviewed Labs: ordered.    MDM  This is a 41 y.o. female who presents to the ED with syncope The differential of this patient includes but is not limited to Anemia, Hypoglycemia, Cardiac arrhythmia, Valvular disorder, AAA rupture, Aortic Dissection, Ectopic Rupture, PE, SAH, Orthostatic hypotension, Seizure, Vasovagal   Initial Impression   Well appearing. No acute distress.  History consistent with orthostatic Low suspicion for AAA, Aortic dissection, PE, SAH, or seizure  I personally ordered, reviewed, and interpreted all laboratory work and imaging and agree with radiologist interpretation. Results interpreted below:  EKG reveals no ischemic changes, QT normal, no other concerning signs for arrhythmia. She has a negative pregnancy test CBC has no leukocytosis, hemoglobin is 7.6 compared to 8.0 within the past  year.  This is microcytic. Electrolytes are normal.  Creatinine stable.  No acidosis.  Assessment/Plan:  Patient is presenting with a syncopal episode and dizziness.  She had no signs of any arrhythmia.  Of low suspicion for cardiac etiology.  She is not pregnant so this is not an ectopic pregnancy.  Based on history and exam, this seems orthostatic in nature and likely secondary to dehydration and chronic anemia.  I reviewed her past records and she was notably anemic just over the past 4 years.  She has not been on treatment for this.  She is not being followed by any PCP or any hematologist for this.  I have recommended she followed up with the [facility] for further work-up.  I have also discussed with her that she should start an iron supplement as the most likely etiology.  She should hydrate plenty.   Charting Requirements Additional history is obtained from:  Independent historian External Records from outside source obtained and reviewed including: Hemoglobin levels Social Determinants of Health:   Has no PCP and not insured. Pertinant PMH that complicates patient's illness: Anemia  Patient Care Problems that were addressed during this visit: -Syncope: Acute illness with systemic symptoms This patient was maintained on a cardiac monitor/telemetry. I personally viewed and interpreted the cardiac monitor which reveals an underlying rhythm of normal sinus rhythm Medications given in ED: IV  fluids Reevaluation of the patient after these medicines showed that the patient improved I have reviewed home medications and made changes accordingly.  Recommend iron supplement Critical Care Interventions: None Consultations: None Disposition: Discharge  Portions of this note were generated with Dragon dictation software. Dictation errors may occur despite best attempts at proofreading.     Final Clinical Impression(s) / ED Diagnoses Final diagnoses:  Syncope, unspecified syncope type    Rx / DC Orders ED Discharge Orders     None         Claudie Leach, PA-C 01/23/22 1200    Sloan Leiter, DO 01/25/22 [phone]

## 2022-01-23 NOTE — ED Triage Notes (Signed)
Patient presents to ED via POV from home. Reports yesterday she took a hot shower and had a syncopal event after getting out of the shower and sitting down. Patient reports mild dizziness at this time.

## 2022-01-23 NOTE — Discharge Instructions (Signed)
You are seen today for a syncopal episode.  You are found to have severe anemia.  I recommend starting to take an iron supplement daily that he can get over-the-counter.  I also recommended contacting the [facility] to schedule follow-up visit. He should continue to drink plenty of fluids to try to rehydrate yourself.  Please return if any worsening symptoms.

## 2022-02-27 ENCOUNTER — Emergency Department (HOSPITAL_BASED_OUTPATIENT_CLINIC_OR_DEPARTMENT_OTHER)
Admission: EM | Admit: 2022-02-27 | Discharge: 2022-02-27 | Discharge status: Against Medical Advice | Payer: Self-pay | Attending: Emergency Medicine | Admitting: Emergency Medicine

## 2022-02-27 ENCOUNTER — Other Ambulatory Visit: Payer: Self-pay

## 2022-02-27 ENCOUNTER — Encounter (HOSPITAL_BASED_OUTPATIENT_CLINIC_OR_DEPARTMENT_OTHER): Payer: Self-pay | Admitting: Emergency Medicine

## 2022-02-27 DIAGNOSIS — S01511A Laceration without foreign body of lip, initial encounter: Secondary | ICD-10-CM | POA: Insufficient documentation

## 2022-02-27 DIAGNOSIS — X58XXXA Exposure to other specified factors, initial encounter: Secondary | ICD-10-CM | POA: Insufficient documentation
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
## 2022-02-28 ENCOUNTER — Emergency Department (HOSPITAL_BASED_OUTPATIENT_CLINIC_OR_DEPARTMENT_OTHER)
Admission: EM | Admit: 2022-02-28 | Discharge: 2022-02-28 | Disposition: A | Discharge status: Home / Self Care | Payer: Self-pay | Attending: Emergency Medicine | Admitting: Emergency Medicine

## 2022-02-28 ENCOUNTER — Other Ambulatory Visit: Payer: Self-pay

## 2022-02-28 ENCOUNTER — Encounter (HOSPITAL_BASED_OUTPATIENT_CLINIC_OR_DEPARTMENT_OTHER): Payer: Self-pay | Admitting: Emergency Medicine

## 2022-02-28 DIAGNOSIS — W500XXA Accidental hit or strike by another person, initial encounter: Secondary | ICD-10-CM | POA: Insufficient documentation

## 2022-02-28 DIAGNOSIS — S01511A Laceration without foreign body of lip, initial encounter: Secondary | ICD-10-CM | POA: Insufficient documentation

## 2022-02-28 MED ORDER — AMOXICILLIN 500 MG PO CAPS: 1000.0000 mg | ORAL_CAPSULE | Freq: Two times a day (BID) | ORAL | 0 refills | Status: DC

## 2022-02-28 NOTE — ED Triage Notes (Signed)
Pt reports lac to inner upper lip; injury occurred Sunday; no bleeding at this time

## 2022-02-28 NOTE — Discharge Instructions (Addendum)
You have been seen today for your complaint of lip laceration. Your discharge medications include amoxicillin.  This is antibiotic.  You should take it as prescribed.  You should take the entire duration of the prescription. Follow up with: Your primary care provider next week Please seek immediate medical care if you develop any of the following symptoms: The edges of your wound break open. Your face or the area under your jaw becomes swollen. You have trouble breathing or swallowing. At this time there does not appear to be the presence of an emergent medical condition, however there is always the potential for conditions to change. Please read and follow the below instructions.  Do not take your medicine if  develop an itchy rash, swelling in your mouth or lips, or difficulty breathing; call 911 and seek immediate emergency medical attention if this occurs.  You may review your lab tests and imaging results in their entirety on your MyChart account.  Please discuss all results of fully with your primary care provider and other specialist at your follow-up visit.  Note: Portions of this text may have been transcribed using voice recognition software. Every effort was made to ensure accuracy; however, inadvertent computerized transcription errors may still be present.
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
## 2022-04-13 ENCOUNTER — Emergency Department (HOSPITAL_BASED_OUTPATIENT_CLINIC_OR_DEPARTMENT_OTHER): Payer: Self-pay

## 2022-04-13 ENCOUNTER — Emergency Department (HOSPITAL_BASED_OUTPATIENT_CLINIC_OR_DEPARTMENT_OTHER)
Admission: EM | Admit: 2022-04-13 | Discharge: 2022-04-13 | Disposition: A | Discharge status: Home / Self Care | Payer: Self-pay | Attending: Emergency Medicine | Admitting: Emergency Medicine

## 2022-04-13 ENCOUNTER — Encounter (HOSPITAL_BASED_OUTPATIENT_CLINIC_OR_DEPARTMENT_OTHER): Payer: Self-pay

## 2022-04-13 DIAGNOSIS — Z20822 Contact with and (suspected) exposure to covid-19: Secondary | ICD-10-CM | POA: Insufficient documentation

## 2022-04-13 DIAGNOSIS — J101 Influenza due to other identified influenza virus with other respiratory manifestations: Secondary | ICD-10-CM | POA: Insufficient documentation

## 2022-04-13 DIAGNOSIS — J111 Influenza due to unidentified influenza virus with other respiratory manifestations: Secondary | ICD-10-CM

## 2022-04-13 LAB — RESP PANEL BY RT-PCR (FLU A&B, COVID) ARPGX2
Influenza A by PCR: POSITIVE — AB
Influenza B by PCR: NEGATIVE
SARS Coronavirus 2 by RT PCR: NEGATIVE

## 2022-04-13 LAB — GROUP A STREP BY PCR: Group A Strep by PCR: NOT DETECTED

## 2022-04-13 MED ORDER — IBUPROFEN 800 MG PO TABS
800.0000 mg | ORAL_TABLET | Freq: Once | ORAL | Status: AC
  Administered 2022-04-13: 800 mg via ORAL
  Filled 2022-04-13: qty 1

## 2022-04-13 MED ORDER — ACETAMINOPHEN 325 MG PO TABS
650.0000 mg | ORAL_TABLET | Freq: Once | ORAL | Status: AC
  Administered 2022-04-13: 650 mg via ORAL
  Filled 2022-04-13: qty 2

## 2022-04-13 MED ORDER — OSELTAMIVIR PHOSPHATE 75 MG PO CAPS: 75.0000 mg | ORAL_CAPSULE | Freq: Two times a day (BID) | ORAL | 0 refills | Status: DC

## 2022-04-13 NOTE — ED Provider Notes (Signed)
[facility] EMERGENCY DEPARTMENT Provider Note   CSN: [id number] Arrival date & time: 04/13/22  1247     History  Chief Complaint  Patient presents with   Nasal Congestion   Generalized Body Aches    [name] is a 41 y.o. female.  Patient with a 2-day history of cough, congestion, body aches, sore throat and headache.  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  Does not know if she has had a fever but has felt warm.  Shaking chills.  Congestion and cough productive of clear mucus with nausea, body aches and headache.  Reports she got her flu shot in February.   Good p.o. intake and urine output.  No vomiting or diarrhea.  No chest pain or shortness of breath.  The history is provided by the patient.       Home Medications Prior to Admission medications   Medication Sig Start Date End Date Taking? Authorizing Provider  amoxicillin (AMOXIL) 500 MG capsule Take 2 capsules (1,000 mg total) by mouth 2 (two) times daily. 02/28/22   Schutt, Edsel Petrin, PA-C  cyclobenzaprine (FLEXERIL) 10 MG tablet Take 1 tablet (10 mg total) by mouth 2 (two) times daily as needed for muscle spasms. 06/01/21   Jeannie Fend, PA-C  gabapentin (NEURONTIN) 300 MG capsule Take 1 capsule (300 mg total) by mouth 3 (three) times daily as needed for up to 30 doses. 02/24/21   Terald Sleeper, MD  Lidocaine (HM LIDOCAINE PATCH) 4 % PTCH Apply 1 patch topically every 12 (twelve) hours as needed. 09/12/21   Haskel Schroeder, PA-C  naproxen (NAPROSYN) 500 MG tablet Take 1 tablet (500 mg total) by mouth 2 (two) times daily. 06/01/21   Jeannie Fend, PA-C  valACYclovir (VALTREX) 1000 MG tablet Take 1 tablet (1,000 mg total) by mouth 3 (three) times daily. 12/23/18   Eustace Moore, MD      Allergies    Hydrocodone    Review of Systems   Review of Systems  Constitutional:  Positive for activity change, appetite change and fatigue. Negative for fever.  HENT:  Positive  for congestion and rhinorrhea.   Respiratory:  Positive for cough. Negative for shortness of breath.   Cardiovascular:  Negative for chest pain.  Gastrointestinal:  Negative for abdominal pain, nausea and vomiting.  Genitourinary:  Negative for dysuria.  Musculoskeletal:  Positive for arthralgias and myalgias.  Skin:  Negative for rash.  Neurological:  Positive for weakness and headaches.   all other systems are negative except as noted in the HPI and PMH.    Physical Exam Updated Vital Signs BP 126/75   Pulse 76   Temp 98.9 F (37.2 C)   Resp 16   Ht 5\' 4"  (1.626 m)   Wt 65.8 kg   LMP 04/02/2022   SpO2 99%   BMI 24.89 kg/m  Physical Exam Vitals and nursing note reviewed.  Constitutional:      General: She is not in acute distress.    Appearance: She is well-developed. She is not ill-appearing.  HENT:     Head: Normocephalic and atraumatic.     Nose: Congestion and rhinorrhea present.     Mouth/Throat:     Pharynx: No oropharyngeal exudate.  Eyes:     Conjunctiva/sclera: Conjunctivae normal.     Pupils: Pupils are equal, round, and reactive to light.  Neck:     Comments: No meningismus. Cardiovascular:     Rate and  Rhythm: Normal rate and regular rhythm.     Heart sounds: Normal heart sounds. No murmur heard. Pulmonary:     Effort: Pulmonary effort is normal. No respiratory distress.     Breath sounds: Normal breath sounds.  Abdominal:     Palpations: Abdomen is soft.     Tenderness: There is no abdominal tenderness. There is no guarding or rebound.  Musculoskeletal:        General: No tenderness. Normal range of motion.     Cervical back: Normal range of motion and neck supple.  Skin:    General: Skin is warm.  Neurological:     Mental Status: She is alert and oriented to person, place, and time.     Cranial Nerves: No cranial nerve deficit.     Motor: No abnormal muscle tone.     Coordination: Coordination normal.     Comments:  5/5 strength throughout. CN  2-12 intact.Equal grip strength.   Psychiatric:        Behavior: Behavior normal.     ED Results / Procedures / Treatments   Labs (all labs ordered are listed, but only abnormal results are displayed) Labs Reviewed  RESP PANEL BY RT-PCR (FLU A&B, COVID) ARPGX2 - Abnormal; Notable for the following components:      Result Value   Influenza A by PCR POSITIVE (*)    All other components within normal limits  GROUP A STREP BY PCR    EKG None  Radiology DG Chest 2 View  Result Date: 04/13/2022 CLINICAL DATA:  Cough and fever EXAM: CHEST - 2 VIEW COMPARISON:  09/12/2021 FINDINGS: The heart size and mediastinal contours are within normal limits. Both lungs are clear. The visualized skeletal structures are unremarkable. IMPRESSION: No active cardiopulmonary disease. Electronically Signed   By: Marlan Palau M.D.   On: 04/13/2022 15:03    Procedures Procedures    Medications Ordered in ED Medications  acetaminophen (TYLENOL) tablet 650 mg (has no administration in time range)  ibuprofen (ADVIL) tablet 800 mg (has no administration in time range)    ED Course/ Medical Decision Making/ A&P                           Medical Decision Making Amount and/or Complexity of Data Reviewed Labs: ordered. Decision-making details documented in ED Course. Radiology: ordered and independent interpretation performed. Decision-making details documented in ED Course. ECG/medicine tests: ordered and independent interpretation performed. Decision-making details documented in ED Course.  Risk OTC drugs. Prescription drug management.   2 days of cough, congestion, body aches, chills.  No hypoxia or increased work of breathing.  Vital stable, no distress, clear lungs.  Initial COVID test is negative but influenza swab is positive.  Risks and benefits of Tamiflu discussed with patient which she accepts. CXR negative. Results reviewed and interpreted by me.  Recommend p.o. hydrate patient at  home, antipyretics and PCP follow-up. Return precautions discussed.        Final Clinical Impression(s) / ED Diagnoses Final diagnoses:  Influenza    Rx / DC Orders ED Discharge Orders     None         Diany Formosa, Jeannett Senior, MD 04/13/22 1651

## 2022-04-13 NOTE — Discharge Instructions (Signed)
Your flu test is positive.  Keep yourself hydrated.  Use Tylenol or Motrin as needed for aches and for fevers.  Follow-up with your doctor.  Return to the ED with chest pain, shortness of breath, any other concerns.

## 2022-04-13 NOTE — ED Triage Notes (Addendum)
Pt states that she has had congestion x 2 days. Pt states she has had nausea, body aches, coughing.   Pt adds coworker recently had strep.

## 2022-04-15 IMAGING — DX DG RIBS W/ CHEST 3+V*L*
3 series · 4 of 4 positions shown · non-contrast
Comparison: Chest radiograph dated 06/20/2016.

CLINICAL DATA: 39-year-old female with trauma to the left chest
wall.

EXAM:
LEFT RIBS AND CHEST - 3+ VIEW

[chest pa]
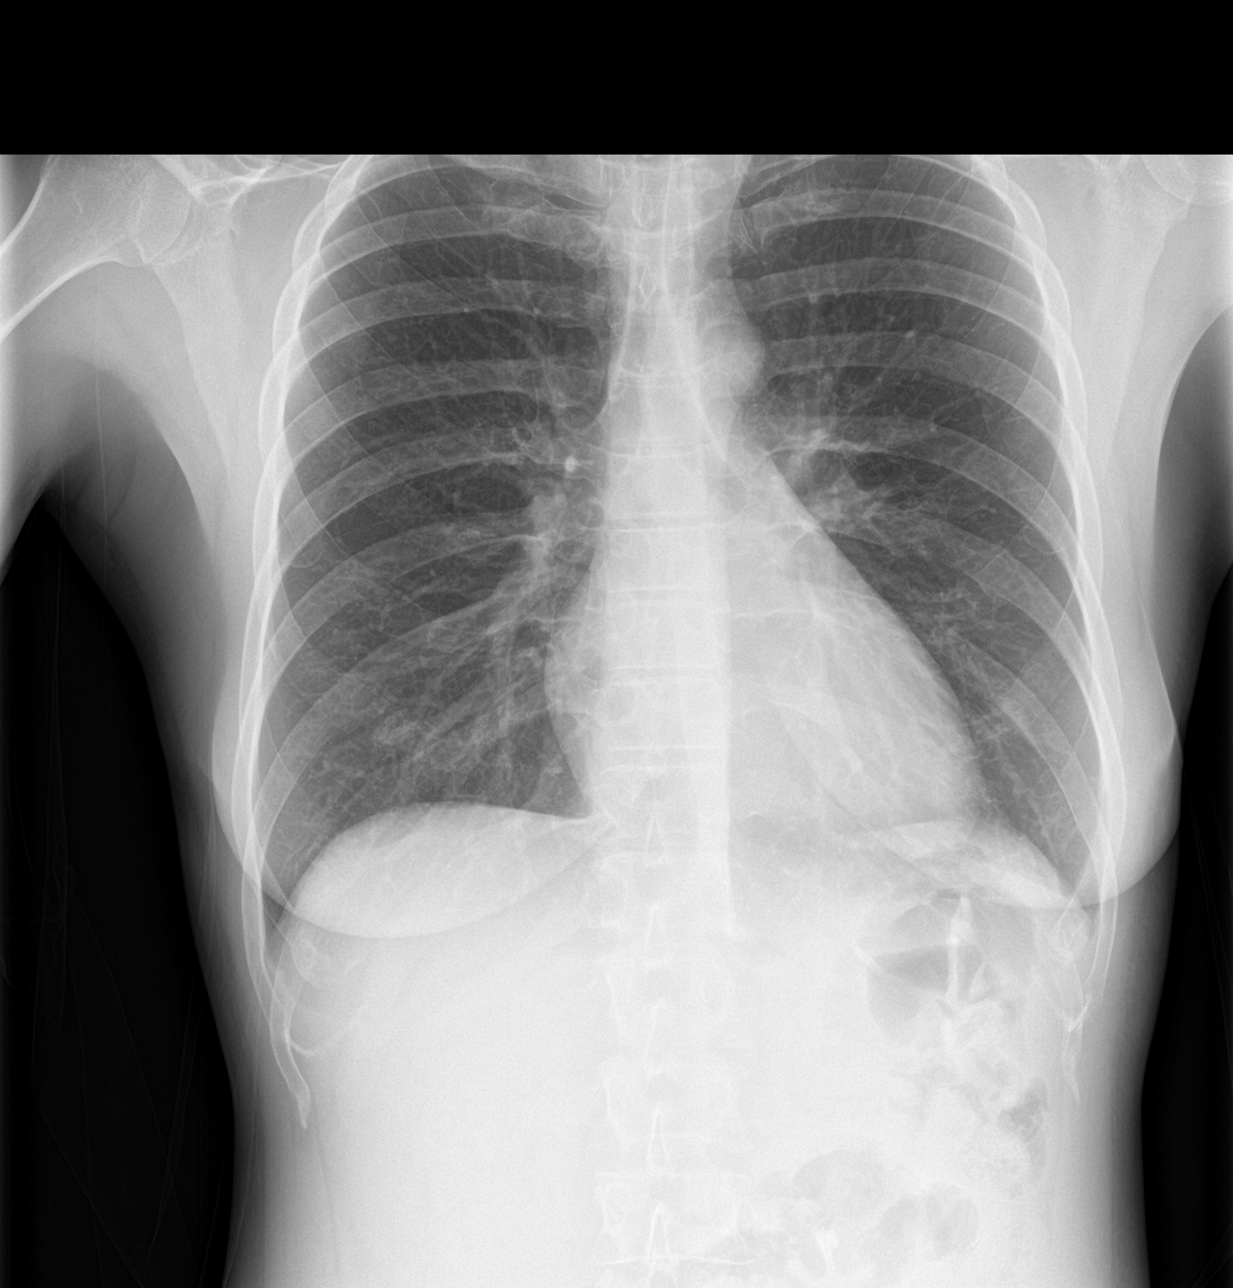

[rib pa]
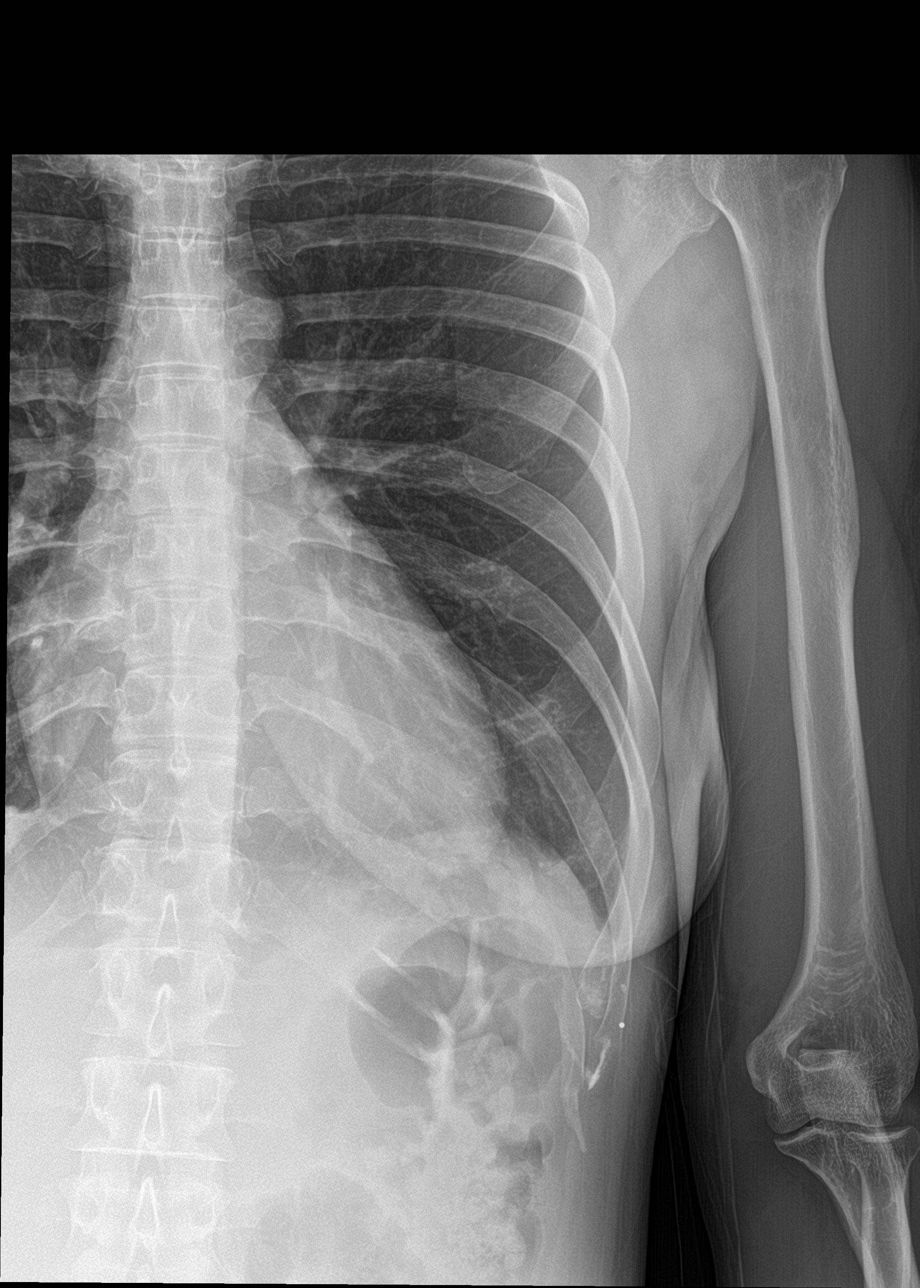

[Series 3: rib pa obl · 0.14mm/px · 2 of 2 slices shown]
[im 1/2]
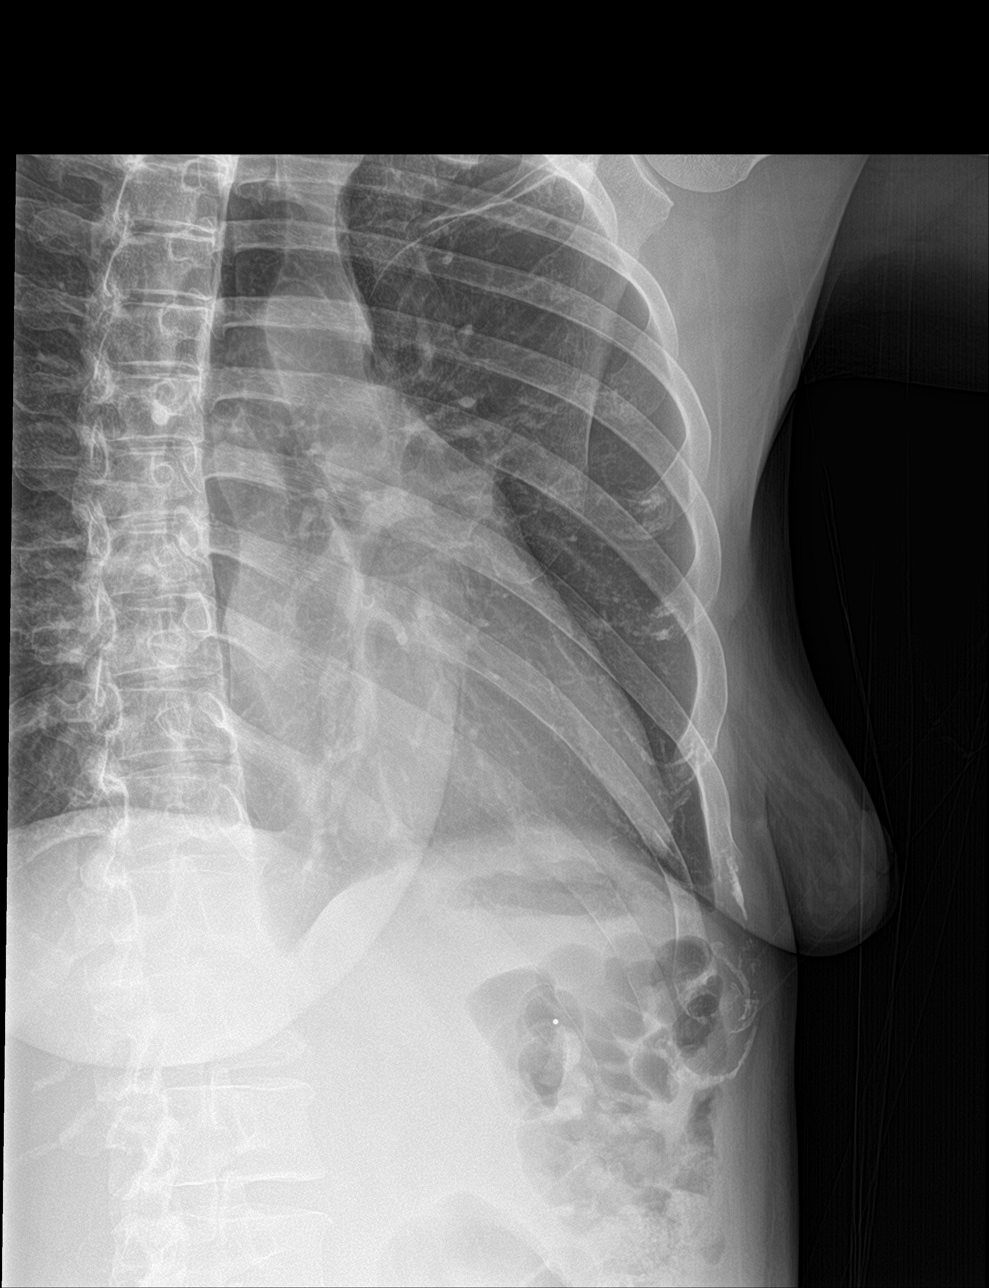
[im 2/2]
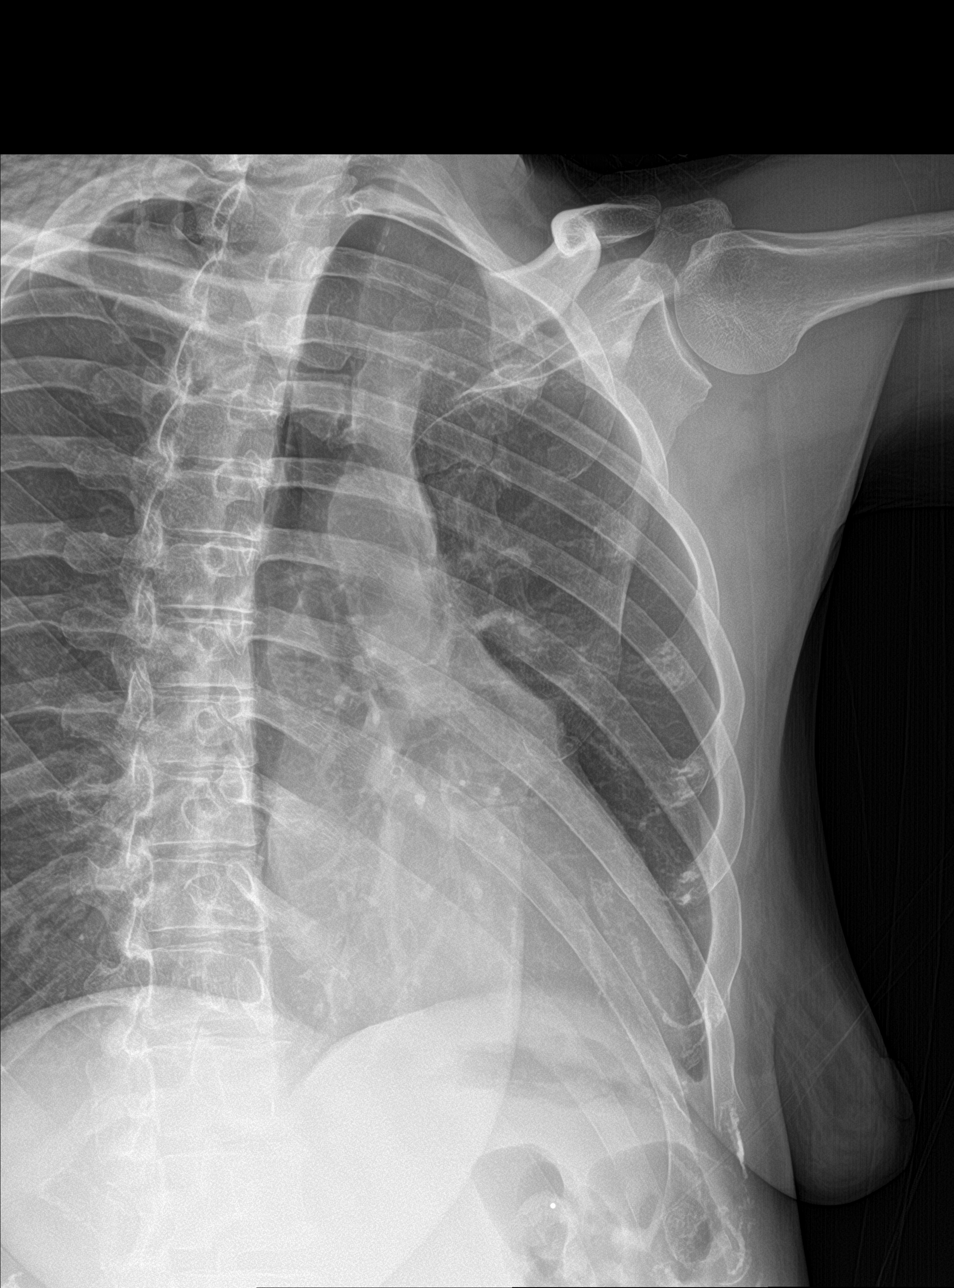

[4 of 4 positions shown; findings below may reference images not displayed]

FINDINGS: The lungs are clear. There is no pleural effusion pneumothorax. The
cardiac silhouette is within limits. There is a nondisplaced the
fracture of the posterior left the tenth rib.
IMPRESSION: Nondisplaced fracture of the posterior left tenth rib. No
pneumothorax.

## 2022-05-19 ENCOUNTER — Encounter (HOSPITAL_BASED_OUTPATIENT_CLINIC_OR_DEPARTMENT_OTHER): Payer: Self-pay | Admitting: Emergency Medicine

## 2022-05-19 ENCOUNTER — Other Ambulatory Visit: Payer: Self-pay

## 2022-05-19 ENCOUNTER — Emergency Department (HOSPITAL_BASED_OUTPATIENT_CLINIC_OR_DEPARTMENT_OTHER)
Admission: EM | Admit: 2022-05-19 | Discharge: 2022-05-19 | Disposition: A | Discharge status: Home / Self Care | Payer: Self-pay | Attending: Emergency Medicine | Admitting: Emergency Medicine

## 2022-05-19 DIAGNOSIS — K047 Periapical abscess without sinus: Secondary | ICD-10-CM | POA: Insufficient documentation

## 2022-05-19 MED ORDER — IBUPROFEN 800 MG PO TABS: 800.0000 mg | ORAL_TABLET | Freq: Three times a day (TID) | ORAL | 0 refills | Status: DC

## 2022-05-19 MED ORDER — AMOXICILLIN-POT CLAVULANATE 875-125 MG PO TABS: 1.0000 | ORAL_TABLET | Freq: Two times a day (BID) | ORAL | 0 refills | Status: DC

## 2022-05-19 NOTE — Discharge Instructions (Addendum)
You are seen today for dental abscess.  Take Augmentin twice daily for the next week, take ibuprofen every 8 hours as needed for inflammation.  Follow-up with dentist, information above to establish care.  If you are unable to swallow, high fevers, new or concerning symptoms return to the ED for additional evaluation.

## 2022-05-19 NOTE — ED Provider Notes (Signed)
[facility] EMERGENCY DEPARTMENT Provider Note   CSN: [id number] Arrival date & time: 05/19/22  1914     History  Chief Complaint  Patient presents with   Dental Pain    [name] is a 41 y.o. female.   Dental Pain    Patient presents due to dental pain.  Started 2 weeks ago after breaking a tooth, starting to swell somewhat surrounding the tooth.  No fevers at home, able to swallow without any difficulty.  No changes in voice.  Has been unable to see a dentist due to lack of insurance.  Home Medications Prior to Admission medications   Medication Sig Start Date End Date Taking? Authorizing Provider  amoxicillin-clavulanate (AUGMENTIN) 875-125 MG tablet Take 1 tablet by mouth every 12 (twelve) hours. 05/19/22  Yes Theron Arista, PA-C  ibuprofen (ADVIL) 800 MG tablet Take 1 tablet (800 mg total) by mouth 3 (three) times daily. 05/19/22  Yes Theron Arista, PA-C  cyclobenzaprine (FLEXERIL) 10 MG tablet Take 1 tablet (10 mg total) by mouth 2 (two) times daily as needed for muscle spasms. 06/01/21   Jeannie Fend, PA-C  gabapentin (NEURONTIN) 300 MG capsule Take 1 capsule (300 mg total) by mouth 3 (three) times daily as needed for up to 30 doses. 02/24/21   Terald Sleeper, MD  Lidocaine (HM LIDOCAINE PATCH) 4 % PTCH Apply 1 patch topically every 12 (twelve) hours as needed. 09/12/21   Haskel Schroeder, PA-C  oseltamivir (TAMIFLU) 75 MG capsule Take 1 capsule (75 mg total) by mouth every 12 (twelve) hours. 04/13/22   Rancour, Jeannett Senior, MD  valACYclovir (VALTREX) 1000 MG tablet Take 1 tablet (1,000 mg total) by mouth 3 (three) times daily. 12/23/18   Eustace Moore, MD      Allergies    Hydrocodone    Review of Systems   Review of Systems  Physical Exam Updated Vital Signs BP 127/79 (BP Location: Right Arm)   Pulse 74   Temp 97.6 F (36.4 C)   Resp 18   Ht 5\' 3"  (1.6 m)   Wt 65.8 kg   SpO2 100%   BMI 25.69 kg/m  Physical Exam Vitals and nursing note  reviewed. Exam conducted with a chaperone present.  Constitutional:      Appearance: Normal appearance.  HENT:     Head: Normocephalic.     Mouth/Throat:     Mouth: Mucous membranes are moist.     Pharynx: No oropharyngeal exudate or posterior oropharyngeal erythema.      Comments: Poor dentition.  No sublingual tenderness or swelling.  Normal phonation. Eyes:     Extraocular Movements: Extraocular movements intact.     Pupils: Pupils are equal, round, and reactive to light.  Cardiovascular:     Rate and Rhythm: Normal rate and regular rhythm.  Pulmonary:     Effort: Pulmonary effort is normal.     Breath sounds: Normal breath sounds.  Musculoskeletal:     Cervical back: Normal range of motion. No rigidity or tenderness.  Neurological:     Mental Status: She is alert.  Psychiatric:        Mood and Affect: Mood normal.     ED Results / Procedures / Treatments   Labs (all labs ordered are listed, but only abnormal results are displayed) Labs Reviewed - No data to display  EKG None  Radiology No results found.  Procedures Procedures    Medications Ordered in ED Medications - No data to display  ED Course/ Medical Decision Making/ A&P                           Medical Decision Making Risk Prescription drug management.   Patient presents due to dental pain.  She is afebrile, no facial swelling.  She is a localized area of an duration surrounding lower left tooth number 5.  No specific fluctuance, not amenable to I&D.  No signs of retropharyngeal abscess, Ludwig angina, facial cellulitis.  Will cover with Augmentin, anti-inflammatories and have her follow-up with a dentist.  Resources were provided.  Return precaution discussed, stable for outpatient follow-up at this time.        Final Clinical Impression(s) / ED Diagnoses Final diagnoses:  Dental abscess    Rx / DC Orders ED Discharge Orders          Ordered    amoxicillin-clavulanate (AUGMENTIN)  875-125 MG tablet  Every 12 hours        05/19/22 2148    ibuprofen (ADVIL) 800 MG tablet  3 times daily        05/19/22 2148              Theron Arista, Cordelia Poche 05/19/22 2152    Gwyneth Sprout, MD 05/19/22 2336

## 2022-05-19 NOTE — ED Triage Notes (Signed)
Patient states she has left lower dental and mouth pain due to a broken tooth, states she thinks she has an abscess.

## 2022-06-13 ENCOUNTER — Encounter (HOSPITAL_COMMUNITY): Payer: Self-pay | Admitting: Emergency Medicine

## 2022-06-13 ENCOUNTER — Observation Stay (HOSPITAL_COMMUNITY)
Admission: EM | Admit: 2022-06-13 | Discharge: 2022-06-14 | Disposition: A | Discharge status: Home / Self Care | Payer: BC Managed Care – PPO | Attending: Internal Medicine | Admitting: Internal Medicine

## 2022-06-13 ENCOUNTER — Other Ambulatory Visit: Payer: Self-pay

## 2022-06-13 DIAGNOSIS — F17210 Nicotine dependence, cigarettes, uncomplicated: Secondary | ICD-10-CM | POA: Insufficient documentation

## 2022-06-13 DIAGNOSIS — D62 Acute posthemorrhagic anemia: Secondary | ICD-10-CM | POA: Diagnosis not present

## 2022-06-13 DIAGNOSIS — E041 Nontoxic single thyroid nodule: Secondary | ICD-10-CM | POA: Insufficient documentation

## 2022-06-13 DIAGNOSIS — Z79899 Other long term (current) drug therapy: Secondary | ICD-10-CM | POA: Diagnosis not present

## 2022-06-13 DIAGNOSIS — K0889 Other specified disorders of teeth and supporting structures: Secondary | ICD-10-CM | POA: Diagnosis present

## 2022-06-13 DIAGNOSIS — D649 Anemia, unspecified: Secondary | ICD-10-CM

## 2022-06-13 DIAGNOSIS — D75839 Thrombocytosis, unspecified: Secondary | ICD-10-CM | POA: Insufficient documentation

## 2022-06-13 DIAGNOSIS — N920 Excessive and frequent menstruation with regular cycle: Secondary | ICD-10-CM | POA: Insufficient documentation

## 2022-06-13 DIAGNOSIS — N179 Acute kidney failure, unspecified: Secondary | ICD-10-CM | POA: Insufficient documentation

## 2022-06-13 DIAGNOSIS — K047 Periapical abscess without sinus: Secondary | ICD-10-CM | POA: Insufficient documentation

## 2022-06-13 DIAGNOSIS — D509 Iron deficiency anemia, unspecified: Secondary | ICD-10-CM | POA: Insufficient documentation

## 2022-06-13 LAB — BASIC METABOLIC PANEL
Anion gap: 7 (ref 5–15)
BUN: 18 mg/dL (ref 6–20)
CO2: 24 mmol/L (ref 22–32)
Calcium: 8.5 mg/dL — ABNORMAL LOW (ref 8.9–10.3)
Chloride: 107 mmol/L (ref 98–111)
Creatinine, Ser: 1.24 mg/dL — ABNORMAL HIGH (ref 0.44–1.00)
GFR, Estimated: 56 mL/min — ABNORMAL LOW (ref >60)
Glucose, Bld: 95 mg/dL (ref 70–99)
Potassium: 4.1 mmol/L (ref 3.5–5.1)
Sodium: 138 mmol/L (ref 135–145)

## 2022-06-13 LAB — CBC WITH DIFFERENTIAL/PLATELET
Abs Immature Granulocytes: 0.02 10*3/uL (ref 0.00–0.07)
Basophils Absolute: 0.1 10*3/uL (ref 0.0–0.1)
Basophils Relative: 2 %
Eosinophils Absolute: 0.2 10*3/uL (ref 0.0–0.5)
Eosinophils Relative: 4 %
HCT: 22.4 % — ABNORMAL LOW (ref 36.0–46.0)
Hemoglobin: 6.2 g/dL — CL (ref 12.0–15.0)
Immature Granulocytes: 0 %
Lymphocytes Relative: 23 %
Lymphs Abs: 1.2 10*3/uL (ref 0.7–4.0)
MCH: 16.2 pg — ABNORMAL LOW (ref 26.0–34.0)
MCHC: 27.7 g/dL — ABNORMAL LOW (ref 30.0–36.0)
MCV: 58.5 fL — ABNORMAL LOW (ref 80.0–100.0)
Monocytes Absolute: 0.7 10*3/uL (ref 0.1–1.0)
Monocytes Relative: 13 %
Neutro Abs: 3.1 10*3/uL (ref 1.7–7.7)
Neutrophils Relative %: 58 %
Platelets: 725 10*3/uL — ABNORMAL HIGH (ref 150–400)
RBC: 3.83 MIL/uL — ABNORMAL LOW (ref 3.87–5.11)
RDW: 22.4 % — ABNORMAL HIGH (ref 11.5–15.5)
WBC: 5.4 10*3/uL (ref 4.0–10.5)
nRBC: 0 % (ref 0.0–0.2)

## 2022-06-13 LAB — FERRITIN: Ferritin: 2 ng/mL — ABNORMAL LOW (ref 11–307)

## 2022-06-13 LAB — RETICULOCYTES
Immature Retic Fract: 32.7 % — ABNORMAL HIGH (ref 2.3–15.9)
RBC.: 4.37 MIL/uL (ref 3.87–5.11)
Retic Count, Absolute: 55.5 10*3/uL (ref 19.0–186.0)
Retic Ct Pct: 1.3 % (ref 0.4–3.1)

## 2022-06-13 LAB — IRON AND TIBC
Iron: 13 ug/dL — ABNORMAL LOW (ref 28–170)
Saturation Ratios: 3 % — ABNORMAL LOW (ref 10.4–31.8)
TIBC: 447 ug/dL (ref 250–450)
UIBC: 434 ug/dL

## 2022-06-13 LAB — FOLATE: Folate: 10.2 ng/mL (ref >5.9)

## 2022-06-13 LAB — ABO/RH: ABO/RH(D): O POS

## 2022-06-13 LAB — PREPARE RBC (CROSSMATCH)

## 2022-06-13 LAB — VITAMIN B12: Vitamin B-12: 586 pg/mL (ref 180–914)

## 2022-06-13 MED ORDER — SODIUM CHLORIDE 0.9% IV SOLUTION: Freq: Once | INTRAVENOUS | Status: AC

## 2022-06-13 MED ORDER — PENICILLIN V POTASSIUM 250 MG PO TABS
500.0000 mg | ORAL_TABLET | Freq: Once | ORAL | Status: AC
  Administered 2022-06-13: 500 mg via ORAL
  Filled 2022-06-13: qty 2

## 2022-06-13 MED ORDER — IBUPROFEN 400 MG PO TABS
400.0000 mg | ORAL_TABLET | Freq: Once | ORAL | Status: AC
  Administered 2022-06-13: 400 mg via ORAL
  Filled 2022-06-13: qty 1

## 2022-06-13 MED ORDER — ACETAMINOPHEN 325 MG PO TABS
650.0000 mg | ORAL_TABLET | Freq: Once | ORAL | Status: AC
  Administered 2022-06-13: 650 mg via ORAL
  Filled 2022-06-13: qty 2

## 2022-06-13 NOTE — ED Triage Notes (Addendum)
Pt states she was treated for abscess on tongue and left lower gum and was prescribed amoxicillin and finished full course approx 1 week ago. Pt states symptoms of jaw pain, swelling, and difficulty of opening mouth is getting worse despite antibiotics.

## 2022-06-13 NOTE — ED Provider Notes (Signed)
[facility] EMERGENCY DEPARTMENT Provider Note   CSN: [id number] Arrival date & time: 06/13/22  1638     History  Chief Complaint  Patient presents with   Abscess    [name] is a 42 y.o. female.  Patient is a 42 year old female with a past medical history of anemia presenting to the emergency department with dental pain.  The patient states that she was seen in the emergency department a few weeks ago and was treated for a dental abscess.  She states after finishing her antibiotics her pain is returned.  She states that she just recently got insurance so has not yet followed up with a dentist.  She states that her pain is mostly on the left side of her jaw and has intermittent left-sided facial swelling.  She denies any drainage in her mouth.  She denies any fevers or chills.  The patient states that she is also had an increase in craving for ice and some mild nausea.  She states that she is has some mild lightheadedness but has not felt like she was going to pass out.  She states that she does have a known history of anemia and has always had a heavy menstrual cycle but states that she does not take iron supplements and has never been told why she is so anemic.  The history is provided by the patient.  Abscess      Home Medications Prior to Admission medications   Medication Sig Start Date End Date Taking? Authorizing Provider  amoxicillin-clavulanate (AUGMENTIN) 875-125 MG tablet Take 1 tablet by mouth every 12 (twelve) hours. 05/19/22   Theron Arista, PA-C  cyclobenzaprine (FLEXERIL) 10 MG tablet Take 1 tablet (10 mg total) by mouth 2 (two) times daily as needed for muscle spasms. 06/01/21   Jeannie Fend, PA-C  gabapentin (NEURONTIN) 300 MG capsule Take 1 capsule (300 mg total) by mouth 3 (three) times daily as needed for up to 30 doses. 02/24/21   Terald Sleeper, MD  ibuprofen (ADVIL) 800 MG tablet Take 1 tablet (800 mg total) by mouth 3 (three) times  daily. 05/19/22   Theron Arista, PA-C  Lidocaine (HM LIDOCAINE PATCH) 4 % PTCH Apply 1 patch topically every 12 (twelve) hours as needed. 09/12/21   Haskel Schroeder, PA-C  oseltamivir (TAMIFLU) 75 MG capsule Take 1 capsule (75 mg total) by mouth every 12 (twelve) hours. 04/13/22   Rancour, Jeannett Senior, MD  valACYclovir (VALTREX) 1000 MG tablet Take 1 tablet (1,000 mg total) by mouth 3 (three) times daily. 12/23/18   Eustace Moore, MD      Allergies    Hydrocodone    Review of Systems   Review of Systems  Physical Exam Updated Vital Signs BP 132/82   Pulse 69   Temp 98.2 F (36.8 C) (Oral)   Resp 14   Ht 5\' 3"  (1.6 m)   Wt 65.8 kg   SpO2 100%   BMI 25.69 kg/m  Physical Exam Vitals and nursing note reviewed.  Constitutional:      General: She is not in acute distress.    Appearance: Normal appearance.  HENT:     Head: Normocephalic and atraumatic.     Nose: Nose normal.     Mouth/Throat:     Mouth: Mucous membranes are moist.     Pharynx: Oropharynx is clear. Uvula midline.     Comments: Broken tooth #21 with tenderness to palpation, no palpable fluctuance Broken tooth #5 with  mild tenderness to palpation, no palpable fluctuance No tongue swelling or fluctuance, small ~46mm healing appearing laceration to tongue near broken tooth #21 No submandibular swelling, normal phonation, no trismus, tolerating secretions Eyes:     Extraocular Movements: Extraocular movements intact.     Pupils: Pupils are equal, round, and reactive to light.     Comments: Mildly pale conjunctiva  Cardiovascular:     Rate and Rhythm: Normal rate and regular rhythm.     Heart sounds: Normal heart sounds.  Pulmonary:     Effort: Pulmonary effort is normal.     Breath sounds: Normal breath sounds.  Abdominal:     General: Abdomen is flat.     Palpations: Abdomen is soft.     Tenderness: There is no abdominal tenderness.  Musculoskeletal:     Cervical back: Normal range of motion and neck supple.   Neurological:     Mental Status: She is alert.     ED Results / Procedures / Treatments   Labs (all labs ordered are listed, but only abnormal results are displayed) Labs Reviewed  CBC WITH DIFFERENTIAL/PLATELET - Abnormal; Notable for the following components:      Result Value   RBC 3.83 (*)    Hemoglobin 6.2 (*)    HCT 22.4 (*)    MCV 58.5 (*)    MCH 16.2 (*)    MCHC 27.7 (*)    RDW 22.4 (*)    Platelets 725 (*)    All other components within normal limits  BASIC METABOLIC PANEL - Abnormal; Notable for the following components:   Creatinine, Ser 1.24 (*)    Calcium 8.5 (*)    GFR, Estimated 56 (*)    All other components within normal limits  VITAMIN B12  FOLATE  IRON AND TIBC  FERRITIN  RETICULOCYTES  TYPE AND SCREEN  PREPARE RBC (CROSSMATCH)  ABO/RH    EKG None  Radiology No results found.  Procedures .Critical Care  Performed by: Kemper Durie, DO Authorized by: Kemper Durie, DO   Critical care provider statement:    Critical care time (minutes):  35   Critical care was necessary to treat or prevent imminent or life-threatening deterioration of the following conditions:  Circulatory failure   Critical care was time spent personally by me on the following activities:  Development of treatment plan with patient or surrogate, discussions with primary provider, evaluation of patient's response to treatment, examination of patient, obtaining history from patient or surrogate, ordering and performing treatments and interventions, ordering and review of laboratory studies, pulse oximetry, re-evaluation of patient's condition and review of old charts   I assumed direction of critical care for this patient from another provider in my specialty: no     Care discussed with: admitting provider       Medications Ordered in ED Medications  0.9 %  sodium chloride infusion (Manually program via Guardrails IV Fluids) (has no administration in time range)   acetaminophen (TYLENOL) tablet 650 mg (650 mg Oral Given 06/13/22 2237)  ibuprofen (ADVIL) tablet 400 mg (400 mg Oral Given 06/13/22 2237)  penicillin v potassium (VEETID) tablet 500 mg (500 mg Oral Given 06/13/22 2237)    ED Course/ Medical Decision Making/ A&P                           Medical Decision Making This patient presents to the ED with chief complaint(s) of dental pain with pertinent past  medical history of anemia which further complicates the presenting complaint. The complaint involves an extensive differential diagnosis and also carries with it a high risk of complications and morbidity.    The differential diagnosis includes periapical abscess, no evidence of Ludwick's on exam, considering anemia, decrease ice intake and nausea, electrolyte abnormality, dehydration  Additional history obtained: Additional history obtained from N/A Records reviewed previous ED records  ED Course and Reassessment: Patient was initially evaluated by provider in triage and had labs performed in the setting of her history of anemia with increased ice intake and nausea.  Her workup was significant for hemoglobin of 6.2 from her baseline around 7.5.  The patient does report that she is on her menstrual period which is likely the cause of her acute anemia.  She denies any black or bloody stools.  The patient was consented for blood transfusion and will be ordered 1 unit of PRBCs.  The patient does have 2 broken teeth on exam with significant tenderness to palpation especially to tooth #21 concerning for periapical abscess and she will be restarted on antibiotics.  The patient will require outpatient dental follow-up.  Independent labs interpretation:  The following labs were independently interpreted: Anemia with hemoglobin of 6.2 from baseline around 7.5  Independent visualization of imaging: N/A  Consultation: - Consulted or discussed management/test interpretation w/ external professional:  hospitalist  Consideration for admission or further workup: patient requires admission for further management of her acute anemia Social Determinants of health: N/A    Amount and/or Complexity of Data Reviewed Labs: ordered.  Risk OTC drugs. Prescription drug management.          Final Clinical Impression(s) / ED Diagnoses Final diagnoses:  Periapical abscess  Anemia, unspecified type    Rx / DC Orders ED Discharge Orders     None         Kemper Durie, DO 06/13/22 2239

## 2022-06-13 NOTE — ED Provider Triage Note (Signed)
Emergency Medicine Provider Triage Evaluation Note  [name] , a 42 y.o. female  was evaluated in triage.  Pt complains of eating ice and pain left jaw.  Pt just finished an antibiotic for tongue infection  Review of Systems  Positive:  Negative: fever  Physical Exam  BP (!) 150/88 (BP Location: Right Arm)   Pulse 91   Temp 98.2 F (36.8 C)   Resp 20   Ht 5\' 3"  (1.6 m)   Wt 65.8 kg   SpO2 100%   BMI 25.69 kg/m  Gen:   Awake, no distress   Resp:  Normal effort  MSK:   Moves extremities without difficulty  Other:    Medical Decision Making  Medically screening exam initiated at 5:02 PM.  Appropriate orders placed.  [name] was informed that the remainder of the evaluation will be completed by another provider, this initial triage assessment does not replace that evaluation, and the importance of remaining in the ED until their evaluation is complete.     [name], [place] 06/13/22 1704

## 2022-06-14 ENCOUNTER — Observation Stay (HOSPITAL_COMMUNITY): Payer: BC Managed Care – PPO

## 2022-06-14 DIAGNOSIS — K047 Periapical abscess without sinus: Secondary | ICD-10-CM | POA: Insufficient documentation

## 2022-06-14 DIAGNOSIS — N924 Excessive bleeding in the premenopausal period: Secondary | ICD-10-CM

## 2022-06-14 DIAGNOSIS — N920 Excessive and frequent menstruation with regular cycle: Secondary | ICD-10-CM | POA: Insufficient documentation

## 2022-06-14 DIAGNOSIS — D62 Acute posthemorrhagic anemia: Secondary | ICD-10-CM | POA: Diagnosis not present

## 2022-06-14 DIAGNOSIS — D75839 Thrombocytosis, unspecified: Secondary | ICD-10-CM | POA: Insufficient documentation

## 2022-06-14 DIAGNOSIS — N179 Acute kidney failure, unspecified: Secondary | ICD-10-CM | POA: Insufficient documentation

## 2022-06-14 DIAGNOSIS — D509 Iron deficiency anemia, unspecified: Secondary | ICD-10-CM | POA: Insufficient documentation

## 2022-06-14 LAB — CBC
HCT: 24.5 % — ABNORMAL LOW (ref 36.0–46.0)
HCT: 28 % — ABNORMAL LOW (ref 36.0–46.0)
Hemoglobin: 7.1 g/dL — ABNORMAL LOW (ref 12.0–15.0)
Hemoglobin: 8.1 g/dL — ABNORMAL LOW (ref 12.0–15.0)
MCH: 17.5 pg — ABNORMAL LOW (ref 26.0–34.0)
MCH: 18.3 pg — ABNORMAL LOW (ref 26.0–34.0)
MCHC: 29 g/dL — ABNORMAL LOW (ref 30.0–36.0)
MCHC: 28.9 g/dL — ABNORMAL LOW (ref 30.0–36.0)
MCV: 60.5 fL — ABNORMAL LOW (ref 80.0–100.0)
MCV: 63.2 fL — ABNORMAL LOW (ref 80.0–100.0)
Platelets: 645 10*3/uL — ABNORMAL HIGH (ref 150–400)
Platelets: 631 10*3/uL — ABNORMAL HIGH (ref 150–400)
RBC: 4.05 MIL/uL (ref 3.87–5.11)
RBC: 4.43 MIL/uL (ref 3.87–5.11)
RDW: 25.4 % — ABNORMAL HIGH (ref 11.5–15.5)
RDW: 27.5 % — ABNORMAL HIGH (ref 11.5–15.5)
WBC: 4.1 10*3/uL (ref 4.0–10.5)
WBC: 4.8 10*3/uL (ref 4.0–10.5)
nRBC: 0 % (ref 0.0–0.2)
nRBC: 0 % (ref 0.0–0.2)

## 2022-06-14 LAB — PREPARE RBC (CROSSMATCH)

## 2022-06-14 LAB — BASIC METABOLIC PANEL
Anion gap: 5 (ref 5–15)
BUN: 11 mg/dL (ref 6–20)
CO2: 22 mmol/L (ref 22–32)
Calcium: 8.2 mg/dL — ABNORMAL LOW (ref 8.9–10.3)
Chloride: 108 mmol/L (ref 98–111)
Creatinine, Ser: 0.71 mg/dL (ref 0.44–1.00)
GFR, Estimated: >60 mL/min (ref >60)
Glucose, Bld: 80 mg/dL (ref 70–99)
Potassium: 3.7 mmol/L (ref 3.5–5.1)
Sodium: 135 mmol/L (ref 135–145)

## 2022-06-14 LAB — PREGNANCY, URINE: Preg Test, Ur: NEGATIVE

## 2022-06-14 LAB — TSH: TSH: 0.996 u[IU]/mL (ref 0.350–4.500)

## 2022-06-14 MED ORDER — NORETHINDRONE ACETATE 5 MG PO TABS: 5.0000 mg | ORAL_TABLET | Freq: Every day | ORAL | 1 refills | Status: DC

## 2022-06-14 MED ORDER — INFLUENZA VAC SPLIT QUAD 0.5 ML IM SUSY: 0.5000 mL | PREFILLED_SYRINGE | INTRAMUSCULAR | Status: DC

## 2022-06-14 MED ORDER — NORETHINDRONE ACETATE 5 MG PO TABS
5.0000 mg | ORAL_TABLET | Freq: Every day | ORAL | Status: DC
  Filled 2022-06-14 (×2): qty 1

## 2022-06-14 MED ORDER — SODIUM CHLORIDE 0.9% IV SOLUTION: Freq: Once | INTRAVENOUS | Status: AC

## 2022-06-14 MED ORDER — IOHEXOL 350 MG/ML SOLN
75.0000 mL | Freq: Once | INTRAVENOUS | Status: AC | PRN
  Administered 2022-06-14: 75 mL via INTRAVENOUS

## 2022-06-14 MED ORDER — ACETAMINOPHEN 650 MG RE SUPP: 650.0000 mg | Freq: Four times a day (QID) | RECTAL | Status: DC | PRN

## 2022-06-14 MED ORDER — AMOXICILLIN-POT CLAVULANATE 875-125 MG PO TABS: 1.0000 | ORAL_TABLET | Freq: Two times a day (BID) | ORAL | 0 refills | Status: AC

## 2022-06-14 MED ORDER — ACETAMINOPHEN 325 MG PO TABS
650.0000 mg | ORAL_TABLET | Freq: Four times a day (QID) | ORAL | Status: DC | PRN
  Administered 2022-06-14: 650 mg via ORAL
  Filled 2022-06-14 (×2): qty 2

## 2022-06-14 MED ORDER — FERROUS GLUCONATE 324 (38 FE) MG PO TABS: 324.0000 mg | ORAL_TABLET | Freq: Every day | ORAL | 3 refills | Status: DC

## 2022-06-14 MED ORDER — NORETHINDRONE ACETATE 5 MG PO TABS: 5.0000 mg | ORAL_TABLET | Freq: Every day | ORAL | Status: DC

## 2022-06-14 MED ORDER — FERROUS GLUCONATE 324 (38 FE) MG PO TABS
324.0000 mg | ORAL_TABLET | Freq: Every day | ORAL | Status: DC
  Administered 2022-06-14: 324 mg via ORAL
  Filled 2022-06-14 (×2): qty 1

## 2022-06-14 MED ORDER — SODIUM CHLORIDE 0.9 % IV BOLUS
1000.0000 mL | Freq: Once | INTRAVENOUS | Status: AC
  Administered 2022-06-14: 1000 mL via INTRAVENOUS

## 2022-06-14 MED ORDER — SODIUM CHLORIDE 0.9 % IV SOLN
2.0000 g | Freq: Every day | INTRAVENOUS | Status: DC
  Administered 2022-06-14: 2 g via INTRAVENOUS
  Filled 2022-06-14: qty 20

## 2022-06-14 MED ORDER — METRONIDAZOLE 500 MG/100ML IV SOLN
500.0000 mg | Freq: Two times a day (BID) | INTRAVENOUS | Status: DC
  Administered 2022-06-14: 500 mg via INTRAVENOUS
  Filled 2022-06-14: qty 100

## 2022-06-14 NOTE — ED Provider Notes (Signed)
  Physical Exam  BP 122/80   Pulse 71   Temp 98.8 F (37.1 C) (Oral)   Resp (!) 21   Ht 5\' 3"  (1.6 m)   Wt 65.8 kg   SpO2 100%   BMI 25.69 kg/m   Physical Exam Cardiovascular:     Rate and Rhythm: Normal rate.  Pulmonary:     Comments: O2 sat 100 on RA    Procedures  Procedures  ED Course / MDM   Clinical Course as of 06/14/22 0257  Tue Jun 13, 2022  2357 30 F with anemia unclear etiology and dental pain which she is on antibiotics for (dental caries, no obvious facial swelling or abscess). Has heavy menstrual periods and currently on menstrual period getting pRBC.  Pending admission. [VB]  Wed Jun 14, 2022  0020 Spoke with on-call hospitalist Dr Marlowe Sax who will come down to evaluate the patient within the hour and put in orders for admission for treatment of anemia and continued trending as well as dental caries. [VB]    Clinical Course User Index [VB] Elgie Congo, MD   Medical Decision Making Amount and/or Complexity of Data Reviewed Labs: ordered.  Risk OTC drugs. Prescription drug management. Decision regarding hospitalization.          Elgie Congo, MD 06/14/22 [phone]

## 2022-06-14 NOTE — Discharge Summary (Signed)
Physician Discharge Summary   Patient: [name] MRN: [id number] DOB: 1980-11-24  Admit date:     06/13/2022  Discharge date: 06/14/22  Discharge Physician: Jerald Kief A Mithcell Schumpert   PCP: Pcp, No   Recommendations at discharge:    Needs follow up with GYN.  Needs follow up with Dentist.   Discharge Diagnoses: Principal Problem:   Acute on chronic blood loss anemia Active Problems:   Iron deficiency anemia   Menorrhagia   Dental abscess   Thrombocytosis   AKI (acute kidney injury) ([place])  Resolved Problems:   * No resolved hospital problems. *  Hospital Course: 42 year old with past medical history significant for anemia presents with PICA  like symptoms, increase ice intake also she has been complaining of tooth pain, facial pain, she recently has been prescribed antibiotics for a tooth infection.  She noticed that is not better after she completed the antibiotics.  She report heavy menstrual period for a long period of time.   She was started to have a hemoglobin of 6.2.  Assessment and Plan: -Iron deficiency anemia in the setting of chronic blood loss in the setting of menorrhagia She received 2 units of blood during this hospitalization.  Hemoglobin increased to 8. Will be discharged on iron supplements.  Tooth pain:  CT scan neck soft tissue, poor dentition, but not definitive acute dental inflammatory process no oral cavity, lingual or neck abscess identified.  TMJ appears normally located. Will provide information for [facility] dentist clinic Discharged on Augmentin for 7 days  1.6 cm incidental thyroid nodule: Patient aware that she needs follow-up for this and she will need an ultrasound  AKI: Resolved with fluids and blood transfusion  Menorrhagia :  TSH 0.9, pregnancy test negative Discussed case with gynecology, plan to start patient on Aygestin 5 mg daily. GYN will help arrange follow up/  Patient denies significant vaginal bleeding.       Consultants:  GYN Procedures performed: None Disposition: Home Diet recommendation:  Discharge Diet Orders (From admission, onward)     Start     Ordered   06/14/22 0000  Diet - low sodium heart healthy        06/14/22 1705           Cardiac diet DISCHARGE MEDICATION: Allergies as of 06/14/2022       Reactions   Hydrocodone Itching   Tongue itches        Medication List     TAKE these medications    acetaminophen 500 MG tablet Commonly known as: TYLENOL Take 1,500 mg by mouth daily as needed for moderate pain.   amoxicillin-clavulanate 875-125 MG tablet Commonly known as: AUGMENTIN Take 1 tablet by mouth every 12 (twelve) hours for 7 days.   ferrous gluconate 324 MG tablet Commonly known as: FERGON Take 1 tablet (324 mg total) by mouth daily with breakfast. Start taking on: June 15, 2022   gabapentin 300 MG capsule Commonly known as: Neurontin Take 1 capsule (300 mg total) by mouth 3 (three) times daily as needed for up to 30 doses. What changed:  when to take this reasons to take this   ibuprofen 800 MG tablet Commonly known as: ADVIL Take 1 tablet (800 mg total) by mouth 3 (three) times daily. What changed: when to take this   norethindrone 5 MG tablet Commonly known as: AYGESTIN Take 1 tablet (5 mg total) by mouth daily.        [facility] Follow up.   Specialty: Obstetrics and Gynecology Why: We will reach out to schedule a follow up appointment for your bleeding Contact information: [place] [id number] Le[place], [place], DMD Follow up.   Specialty: Dentistry Why: call office to get appointment. Contact information: [place] [address] [phone]                Discharge Exam: Danley Danker Weights   06/13/22 1646 06/14/22 1228  Weight: 65.8 kg 67.3 kg   General; NAD  Condition at  discharge: stable  The results of significant diagnostics from this hospitalization (including imaging, microbiology, ancillary and laboratory) are listed below for reference.   Imaging Studies: CT SOFT TISSUE NECK W CONTRAST  Result Date: 06/14/2022 CLINICAL DATA:  43 year old female was treated for abscess of tongue and left lower gum, completing full course of antibiotics 1 week ago. Increasing jaw pain. EXAM: CT NECK WITH CONTRAST TECHNIQUE: Multidetector CT imaging of the neck was performed using the standard protocol following the bolus administration of intravenous contrast. RADIATION DOSE REDUCTION: This exam was performed according to the departmental dose-optimization program which includes automated exposure control, adjustment of the mA and/or kV according to patient size and/or use of iterative reconstruction technique. CONTRAST:  25mL OMNIPAQUE IOHEXOL 350 MG/ML SOLN COMPARISON:  Noncontrast face CT 07/23/2020. FINDINGS: Pharynx and larynx: Glottis is closed. Laryngeal and pharyngeal soft tissue contours are within normal limits. Parapharyngeal and retropharyngeal spaces are within normal limits. Salivary glands: Sublingual space, sublingual glands, and submandibular glands appear symmetric and within normal limits. Parotid glands are within normal limits. Oral tongue appears symmetric and within normal limits. Thyroid: Multinodular thyroid enlargement. By CT the largest nodules are up to 16 mm long axis. Lymph nodes: No lymphadenopathy identified. And no localized area of neck or face inflammation is evident. Masticator space soft tissue contours seem to remain normal. Vascular: Major vascular structures in the neck and at the skull base are patent. No atherosclerosis in the neck. Tortuous ICAs. Limited intracranial: Negative. Visualized orbits: Negative. Mastoids and visualized paranasal sinuses: Left maxillary alveolar recess mucous retention cyst is small. Otherwise paranasal sinuses are  clear. No sinus fluid levels. Tympanic cavities, mastoid air cells, and petrous apex air cells are also clear. Skeleton: Poor dentition. Multiple bilateral dental caries, predominantly of the mandibular dentition on the left and the maxillary dentition on the right. But no mandible fracture, and TMJ alignment appears maintained. Superimposed torus mandibularis (normal variant). No definite acute dental inflammatory process. Other visible osseous structures appear negative. Upper chest: Mild mass effect on the trachea from thyroid enlargement but no airway narrowing. Upper lungs are clear. Negative visible superior mediastinum aside from early retro sternal extension of the thyroid greater on the left. IMPRESSION: 1. Poor dentition. But no definite acute dental inflammatory process. And no oral cavity, lingual or neck abscess identified by CT. TMJ appear normally located. Dental consultation might be most valuable. 2. A 1.6 cm incidental thyroid nodule with heterogeneous and enlarged thyroid. Recommend non-emergent thyroid ultrasound. Reference: J Am Coll Radiol. 2015 Feb;12(2): 143-50 Electronically Signed   By: Genevie Ann M.D.   On: 06/14/2022 04:34    Microbiology: Results for orders placed or performed during the hospital encounter of 04/13/22  Resp Panel by RT-PCR (Flu A&B, Covid) Anterior Nasal Swab     Status: Abnormal   Collection Time: 04/13/22 12:58 PM  Specimen: Anterior Nasal Swab  Result Value Ref Range Status   SARS Coronavirus 2 by RT PCR NEGATIVE NEGATIVE Final    Comment: (NOTE) SARS-CoV-2 target nucleic acids are NOT DETECTED.  The SARS-CoV-2 RNA is generally detectable in upper respiratory specimens during the acute phase of infection. The lowest concentration of SARS-CoV-2 viral copies this assay can detect is 138 copies/mL. A negative result does not preclude SARS-Cov-2 infection and should not be used as the sole basis for treatment or other patient management decisions. A  negative result may occur with  improper specimen collection/handling, submission of specimen other than nasopharyngeal swab, presence of viral mutation(s) within the areas targeted by this assay, and inadequate number of viral copies(<138 copies/mL). A negative result must be combined with clinical observations, patient history, and epidemiological information. The expected result is Negative.  Fact Sheet for Patients:  BloggerCourse.com  Fact Sheet for Healthcare Providers:  SeriousBroker.it  This test is no t yet approved or cleared by the [place] FDA and  has been authorized for detection and/or diagnosis of SARS-CoV-2 by FDA under an Emergency Use Authorization (EUA). This EUA will remain  in effect (meaning this test can be used) for the duration of the COVID-19 declaration under Section 564(b)(1) of the Act, 21 U.S.C.section 360bbb-3(b)(1), unless the authorization is terminated  or revoked sooner.       Influenza A by PCR POSITIVE (A) NEGATIVE Final   Influenza B by PCR NEGATIVE NEGATIVE Final    Comment: (NOTE) The Xpert Xpress SARS-CoV-2/FLU/RSV plus assay is intended as an aid in the diagnosis of influenza from Nasopharyngeal swab specimens and should not be used as a sole basis for treatment. Nasal washings and aspirates are unacceptable for Xpert Xpress SARS-CoV-2/FLU/RSV testing.  Fact Sheet for Patients: BloggerCourse.com  Fact Sheet for Healthcare Providers: SeriousBroker.it  This test is not yet approved or cleared by the [place] FDA and has been authorized for detection and/or diagnosis of SARS-CoV-2 by FDA under an Emergency Use Authorization (EUA). This EUA will remain in effect (meaning this test can be used) for the duration of the COVID-19 declaration under Section 564(b)(1) of the Act, 21 U.S.C. section 360bbb-3(b)(1), unless the  authorization is terminated or revoked.  Performed at [facility], [address] [place], [place] [address]   Group A Strep by PCR     Status: None   Collection Time: 04/13/22 12:58 PM   Specimen: Anterior Nasal Swab; Sterile Swab  Result Value Ref Range Status   Group A Strep by PCR NOT DETECTED NOT DETECTED Final    Comment: Performed at [facility], [address] [place], [place] [address]    Labs: CBC: Recent Labs  Lab 06/13/22 1703 06/14/22 0432 06/14/22 1242  WBC 5.4 4.1 4.8  NEUTROABS 3.1  --   --   HGB 6.2* 7.1* 8.1*  HCT 22.4* 24.5* 28.0*  MCV 58.5* 60.5* 63.2*  PLT 725* 645* 631*   Basic Metabolic Panel: Recent Labs  Lab 06/13/22 1703 06/14/22 0432  NA 138 135  K 4.1 3.7  CL 107 108  CO2 24 22  GLUCOSE 95 80  BUN 18 11  CREATININE 1.24* 0.71  CALCIUM 8.5* 8.2*   Liver Function Tests: No results for input(s): "AST", "ALT", "ALKPHOS", "BILITOT", "PROT", "ALBUMIN" in the last 168 hours. CBG: No results for input(s): "GLUCAP" in the last 168 hours.  Discharge time spent: greater than 30 minutes.  Signed: Alba Cory, MD  [facility] 06/14/2022

## 2022-06-14 NOTE — Consult Note (Signed)
   OB/GYN Telephone Consult  [name] is a 42 y.o. [phone] presenting with heavy menstrual bleeding.   I was called for a consult regarding the care of this patient by [facility] .    The provider had a clinical question regarding coordination of care.   The provider presented the following relevant clinical information and I performed a chart review on the patient and reviewed available documentation: Pt presented to ED with tooth pain and was admitted for dental evaluation for abscess.  Also noted to have heavy vaginal bleeding and anemia with Hgb 6.2. She is s/p 1u PRBCs w/ repeat Hgb 7.1. She reports longstanding history of heavy periods and has not seen a gynecologist in years. Her bleeding is slowing. She has been HDS. She does not have a pelvic ultrasound for review.  BP 131/83   Pulse 60   Temp 98 F (36.7 C) (Oral)   Resp 13   Ht 5\' 3"  (1.6 m)   Wt 65.8 kg   SpO2 100%   BMI 25.69 kg/m   Exam- performed by consulting provider  Recommendations:  -Urine pregnancy test (ordered) -Outpatient pelvic ultrasound - our office can coordinate -No current indication for acute gynecologic intervention, however can offer aygestin (norethindrone) 5mg  daily for bleeding control -Recommended MD/APP provide the patient with a referral to the [facility] (any office) for follow up in 2-4 week. I included our main office's information in her discharge paperwork and routed a message to our administrators to help coordinate a follow up appointment after discharge.   Thank you for this consult and if additional recommendations are needed please call [phone] for the OB/GYN attending on service at [facility].   I spent approximately 5 minutes directly consulting with the provider and verbally discussing this case. Additionally 10 minutes minutes was spent performing chart review and documentation.   Gale Journey, MD Attending [place], [facility] for [facility], [place]

## 2022-06-14 NOTE — H&P (Signed)
History and Physical    [name] JXB:[id number] DOB: [birth year] DOA: 06/13/2022  PCP: Pcp, No  Patient coming from: Home  Chief Complaint: Increased ice intake  HPI: [name] is a 42 y.o. female with medical history significant of anemia presents to the ED with pica/increased ice intake and tooth infection.  Vital signs stable.  Labs showing no leukocytosis, hemoglobin 6.2 (previously 7-8), MCV 58.5, platelet count 725k, creatinine 1.2 (baseline 0.6-0.8).  Iron studies consistent with severe iron deficiency anemia- iron 13, iron saturation ratio 3%, and ferritin 2. Patient was given Tylenol, ibuprofen, and penicillin V.  Patient reports history of heavy menstruation since she was a teenager.  She reports remote history of surgery for "ovary cyst" removal.  States the last time she saw a gynecologist was when she was pregnant 14 years ago.  She reports having nausea and states constantly eating ice at home, on average eats 5 large cups of ice every day.  No abdominal pain or vomiting.  Denies history of hematochezia or melena.  Reports history of lightheadedness and fatigue but denies chest pain or shortness of breath.  Patient also states she was seen in the emergency room 2 weeks ago for dental infection and was prescribed Augmentin which she has finished taking but still not improved.  She has not seen a dentist.  Review of Systems:  Review of Systems  All other systems reviewed and are negative.   Past Medical History:  Diagnosis Date   Anemia     Past Surgical History:  Procedure Laterality Date   CESAREAN SECTION     OVARIAN CYST REMOVAL       reports that she has been smoking cigarettes and cigars. She has been smoking an average of .5 packs per day. She has never used smokeless tobacco. She reports current alcohol use. She reports current drug use. Drug: Marijuana.  Allergies  Allergen Reactions   Hydrocodone Itching    Tongue itches    Family History  Problem  Relation Age of Onset   Hypertension Mother    Diabetes Mother    Hypertension Father    Breast cancer Maternal Grandmother     Prior to Admission medications   Medication Sig Start Date End Date Taking? Authorizing Provider  acetaminophen (TYLENOL) 500 MG tablet Take 1,500 mg by mouth daily as needed for moderate pain.   Yes [provider]  gabapentin (NEURONTIN) 300 MG capsule Take 1 capsule (300 mg total) by mouth 3 (three) times daily as needed for up to 30 doses. Patient taking differently: Take 300 mg by mouth daily as needed (for neuropathy). 02/24/21  Yes Wyvonnia Dusky, MD  ibuprofen (ADVIL) 800 MG tablet Take 1 tablet (800 mg total) by mouth 3 (three) times daily. Patient taking differently: Take 800 mg by mouth in the morning and at bedtime. 05/19/22  Yes Sherrill Raring, PA-C  amoxicillin-clavulanate (AUGMENTIN) 875-125 MG tablet Take 1 tablet by mouth every 12 (twelve) hours. Patient not taking: Reported on 06/13/2022 05/19/22   Sherrill Raring, PA-C    Physical Exam: Vitals:   06/14/22 0100 06/14/22 0115 06/14/22 0130 06/14/22 0142  BP: 124/78 116/76 114/75 132/80  Pulse: 70 66 65 65  Resp: (!) 23 19 (!) 22 16  Temp:    98.8 F (37.1 C)  TempSrc:    Oral  SpO2: 100% 100% 99% 100%  Weight:      Height:        Physical Exam Vitals reviewed.  Constitutional:  General: She is not in acute distress. HENT:     Head: Normocephalic and atraumatic.     Mouth/Throat:     Comments: Poor dentition with several broken teeth and gum swelling but no obvious purulent drainage.  No swelling of tongue and easily able to visualize oropharynx.  Patient is able to speak clearly.  No trismus.  No difficulty tolerating secretions. Eyes:     Extraocular Movements: Extraocular movements intact.  Cardiovascular:     Rate and Rhythm: Normal rate and regular rhythm.     Pulses: Normal pulses.  Pulmonary:     Effort: Pulmonary effort is normal. No respiratory distress.      Breath sounds: Normal breath sounds. No wheezing or rales.  Abdominal:     General: Bowel sounds are normal. There is no distension.     Palpations: Abdomen is soft.     Tenderness: There is no abdominal tenderness.  Musculoskeletal:     Cervical back: Normal range of motion.     Right lower leg: No edema.     Left lower leg: No edema.  Skin:    General: Skin is warm and dry.  Neurological:     General: No focal deficit present.     Mental Status: She is alert and oriented to person, place, and time.     Labs on Admission: I have personally reviewed following labs and imaging studies  CBC: Recent Labs  Lab 06/13/22 1703  WBC 5.4  NEUTROABS 3.1  HGB 6.2*  HCT 22.4*  MCV 58.5*  PLT 315*   Basic Metabolic Panel: Recent Labs  Lab 06/13/22 1703  NA 138  K 4.1  CL 107  CO2 24  GLUCOSE 95  BUN 18  CREATININE 1.24*  CALCIUM 8.5*   GFR: Estimated Creatinine Clearance: 54.5 mL/min (A) (by C-G formula based on SCr of 1.24 mg/dL (H)). Liver Function Tests: No results for input(s): "AST", "ALT", "ALKPHOS", "BILITOT", "PROT", "ALBUMIN" in the last 168 hours. No results for input(s): "LIPASE", "AMYLASE" in the last 168 hours. No results for input(s): "AMMONIA" in the last 168 hours. Coagulation Profile: No results for input(s): "INR", "PROTIME" in the last 168 hours. Cardiac Enzymes: No results for input(s): "CKTOTAL", "CKMB", "CKMBINDEX", "TROPONINI" in the last 168 hours. BNP (last 3 results) No results for input(s): "PROBNP" in the last 8760 hours. HbA1C: No results for input(s): "HGBA1C" in the last 72 hours. CBG: No results for input(s): "GLUCAP" in the last 168 hours. Lipid Profile: No results for input(s): "CHOL", "HDL", "LDLCALC", "TRIG", "CHOLHDL", "LDLDIRECT" in the last 72 hours. Thyroid Function Tests: No results for input(s): "TSH", "T4TOTAL", "FREET4", "T3FREE", "THYROIDAB" in the last 72 hours. Anemia Panel: Recent Labs    06/13/22 2208 06/13/22 2232   VITAMINB12  --  586  FOLATE  --  10.2  FERRITIN  --  2*  TIBC  --  447  IRON  --  13*  RETICCTPCT 1.3  --    Urine analysis:    Component Value Date/Time   COLORURINE YELLOW 01/29/2017 1431   APPEARANCEUR CLOUDY (A) 01/29/2017 1431   LABSPEC 1.025 01/29/2017 1431   PHURINE 6.0 01/29/2017 1431   GLUCOSEU NEGATIVE 01/29/2017 1431   HGBUR LARGE (A) 01/29/2017 1431   BILIRUBINUR NEGATIVE 01/29/2017 1431   KETONESUR NEGATIVE 01/29/2017 1431   PROTEINUR 100 (A) 01/29/2017 1431   UROBILINOGEN 0.2 04/19/2010 1845   NITRITE POSITIVE (A) 01/29/2017 1431   LEUKOCYTESUR TRACE (A) 01/29/2017 1431    Radiological Exams on  Admission: No results found.  Assessment and Plan  Symptomatic severe iron deficiency anemia in the setting of chronic blood loss from menorrhagia -Hemoglobin 6.2, MCV 58.5.  Hemoglobin previously 7-8. -Iron studies consistent with severe iron deficiency anemia- iron 13, iron saturation ratio 3%, and ferritin 2.  -Receiving 1 unit PRBCs -Monitor H&H and transfuse if hemoglobin less than 7 -Start oral iron supplement -Outpatient gynecology follow-up and will also benefit from outpatient hematology evaluation.  Concern for dental abscess -Patient has poor dentition with several broken teeth and gum swelling but no obvious purulent drainage.  No swelling of tongue and easily able to visualize oropharynx.  Patient is able to speak clearly.  No trismus.  No difficulty tolerating secretions. -No fever or leukocytosis -CT ordered to assess for possible deeper infection -Treat with IV antibiotics at this time including ceftriaxone and metronidazole -Consult on-call dentist in the morning.  Acute on chronic thrombocytosis Possibly reactive in the setting of severe anemia. -Continue to monitor  AKI Creatinine 1.2 , baseline 0.6-0.8. -IV fluid hydration and repeat labs in the morning -Avoid nephrotoxic agents  DVT prophylaxis: SCDs Code Status: Full Code (discussed  with the patient) Family Communication: No family at bedside. Level of care: Med-Surg Admission status: It is my clinical opinion that referral for OBSERVATION is reasonable and necessary in this patient based on the above information provided. The aforementioned taken together are felt to place the patient at high risk for further clinical deterioration. However, it is anticipated that the patient may be medically stable for discharge from the hospital within 24 to 48 hours.   John Giovanni MD [facility]  If 7PM-7AM, please contact night-coverage [email or web address]  06/14/2022, 2:24 AM

## 2022-06-14 NOTE — ED Notes (Signed)
ED TO INPATIENT HANDOFF REPORT  ED Nurse Name and Phone #: Rolanda Lundborg [phone]  S Name/Age/Gender Liliahna Broaddus 42 y.o. female Room/Bed: 009C/009C  Code Status   Code Status: Full Code  Home/SNF/Other Home Patient oriented to: self, place, time, and situation Is this baseline? Yes   Triage Complete: Triage complete  Chief Complaint Symptomatic anemia [D64.9]  Triage Note Pt states she was treated for abscess on tongue and left lower gum and was prescribed amoxicillin and finished full course approx 1 week ago. Pt states symptoms of jaw pain, swelling, and difficulty of opening mouth is getting worse despite antibiotics.    Allergies Allergies  Allergen Reactions   Hydrocodone Itching    Tongue itches    Level of Care/Admitting Diagnosis ED Disposition     ED Disposition  Admit   Condition  --   [place]: [facility] [[id number]]  Level of Care: Med-Surg [16]  May place patient in observation at [facility] or [facility] if equivalent level of care is available:: Yes  Covid Evaluation: Asymptomatic - no recent exposure (last 10 days) testing not required  Diagnosis: Symptomatic anemia [6761950]  Admitting Physician: Shela Leff [[id number]]  Attending Physician: Shela Leff [[id number]]          B Medical/Surgery History Past Medical History:  Diagnosis Date   Anemia    Past Surgical History:  Procedure Laterality Date   CESAREAN SECTION     OVARIAN CYST REMOVAL       A IV Location/Drains/Wounds Patient Lines/Drains/Airways Status     Active Line/Drains/Airways     Name Placement date Placement time Site Days   Peripheral IV 06/13/22 20 G Anterior;Right Forearm 06/13/22  2207  Forearm  1            Intake/Output Last 24 hours  Intake/Output Summary (Last 24 hours) at 06/14/2022 1053 Last data filed at 06/14/2022 3382 Gross per 24 hour  Intake 1772.32 ml  Output --  Net 1772.32 ml     Labs/Imaging Results for orders placed or performed during the hospital encounter of 06/13/22 (from the past 48 hour(s))  CBC with Differential     Status: Abnormal   Collection Time: 06/13/22  5:03 PM  Result Value Ref Range   WBC 5.4 4.0 - 10.5 K/uL   RBC 3.83 (L) 3.87 - 5.11 MIL/uL   Hemoglobin 6.2 (LL) 12.0 - 15.0 g/dL    Comment: REPEATED TO VERIFY Reticulocyte Hemoglobin testing may be clinically indicated, consider ordering this additional test [id number] THIS CRITICAL RESULT HAS VERIFIED AND BEEN CALLED TO C. ROWE RN BY BONNIE DAVIS ON 01 09 2024 AT [facility], AND HAS BEEN READ BACK.     HCT 22.4 (L) 36.0 - 46.0 %   MCV 58.5 (L) 80.0 - 100.0 fL   MCH 16.2 (L) 26.0 - 34.0 pg   MCHC 27.7 (L) 30.0 - 36.0 g/dL   RDW 22.4 (H) 11.5 - 15.5 %   Platelets 725 (H) 150 - 400 K/uL   nRBC 0.0 0.0 - 0.2 %   Neutrophils Relative % 58 %   Neutro Abs 3.1 1.7 - 7.7 K/uL   Lymphocytes Relative 23 %   Lymphs Abs 1.2 0.7 - 4.0 K/uL   Monocytes Relative 13 %   Monocytes Absolute 0.7 0.1 - 1.0 K/uL   Eosinophils Relative 4 %   Eosinophils Absolute 0.2 0.0 - 0.5 K/uL   Basophils Relative 2 %   Basophils Absolute 0.1 0.0 - 0.1  K/uL   Immature Granulocytes 0 %   Abs Immature Granulocytes 0.02 0.00 - 0.07 K/uL    Comment: Performed at [facility], [place] [address], [place], [place] [id number]  Basic metabolic panel     Status: Abnormal   Collection Time: 06/13/22  5:03 PM  Result Value Ref Range   Sodium 138 135 - 145 mmol/L   Potassium 4.1 3.5 - 5.1 mmol/L   Chloride 107 98 - 111 mmol/L   CO2 24 22 - 32 mmol/L   Glucose, Bld 95 70 - 99 mg/dL    Comment: Glucose reference range applies only to samples taken after fasting for at least 8 hours.   BUN 18 6 - 20 mg/dL   Creatinine, Ser 1.24 (H) 0.44 - 1.00 mg/dL   Calcium 8.5 (L) 8.9 - 10.3 mg/dL   GFR, Estimated 56 (L) >60 mL/min    Comment: (NOTE) Calculated using the CKD-EPI Creatinine Equation (2021)    Anion gap 7 5 - 15     Comment: Performed at [facility] [address] [place], [place] [address]  Type and screen [place]     Status: None (Preliminary result)   Collection Time: 06/13/22  9:52 PM  Result Value Ref Range   ABO/RH(D) O POS    Antibody Screen NEG    Sample Expiration 06/16/2022,2359    Unit Number [id number]    Blood Component Type RED CELLS,LR    Unit division 00    Status of Unit ISSUED,FINAL    Transfusion Status OK TO TRANSFUSE    Crossmatch Result Compatible    Unit Number [id number]    Blood Component Type RBC LR PHER1    Unit division 00    Status of Unit ISSUED    Transfusion Status OK TO TRANSFUSE    Crossmatch Result      Compatible Performed at [facility], [place] [address], [place], [place] [address]   ABO/Rh     Status: None   Collection Time: 06/13/22 10:07 PM  Result Value Ref Range   ABO/RH(D)      O POS Performed at [facility] [address] [place], [place] [address]   Reticulocytes     Status: Abnormal   Collection Time: 06/13/22 10:08 PM  Result Value Ref Range   Retic Ct Pct 1.3 0.4 - 3.1 %   RBC. 4.37 3.87 - 5.11 MIL/uL   Retic Count, Absolute 55.5 19.0 - 186.0 K/uL   Immature Retic Fract 32.7 (H) 2.3 - 15.9 %    Comment: Performed at [facility] [address] [place], [place] [address]  Prepare RBC (crossmatch)     Status: None   Collection Time: 06/13/22 10:09 PM  Result Value Ref Range   Order Confirmation      ORDER PROCESSED BY BLOOD BANK Performed at [facility], [place] [address], [place], [place] [address]   Vitamin B12     Status: None   Collection Time: 06/13/22 10:32 PM  Result Value Ref Range   Vitamin B-12 586 180 - 914 pg/mL    Comment: (NOTE) This assay is not validated for testing neonatal or myeloproliferative syndrome specimens for Vitamin B12 levels. Performed at [facility], [place] [address] [place],  [address]   Folate     Status: None   Collection  Time: 06/13/22 10:32 PM  Result Value Ref Range   Folate 10.2 >5.9  ng/mL    Comment: Performed at [facility], [place] [address], [place], [place] [address]  Iron and TIBC     Status: Abnormal   Collection Time: 06/13/22 10:32 PM  Result Value Ref Range   Iron 13 (L) 28 - 170 ug/dL   TIBC 447 250 - 450 ug/dL   Saturation Ratios 3 (L) 10.4 - 31.8 %   UIBC 434 ug/dL    Comment: Performed at [facility], [place] [address] [place], [place] [address]  Ferritin     Status: Abnormal   Collection Time: 06/13/22 10:32 PM  Result Value Ref Range   Ferritin 2 (L) 11 - 307 ng/mL    Comment: Performed at [facility], [place] [address], [place], [place] [address]  CBC     Status: Abnormal   Collection Time: 06/14/22  4:32 AM  Result Value Ref Range   WBC 4.1 4.0 - 10.5 K/uL   RBC 4.05 3.87 - 5.11 MIL/uL   Hemoglobin 7.1 (L) 12.0 - 15.0 g/dL    Comment: Reticulocyte Hemoglobin testing may be clinically indicated, consider ordering this additional test [id number]    HCT 24.5 (L) 36.0 - 46.0 %   MCV 60.5 (L) 80.0 - 100.0 fL   MCH 17.5 (L) 26.0 - 34.0 pg   MCHC 29.0 (L) 30.0 - 36.0 g/dL   RDW 25.4 (H) 11.5 - 15.5 %   Platelets 645 (H) 150 - 400 K/uL    Comment: REPEATED TO VERIFY   nRBC 0.0 0.0 - 0.2 %    Comment: Performed at [facility], [place] [address] [place], [place] [address]  Basic metabolic panel     Status: Abnormal   Collection Time: 06/14/22  4:32 AM  Result Value Ref Range   Sodium 135 135 - 145 mmol/L   Potassium 3.7 3.5 - 5.1 mmol/L   Chloride 108 98 - 111 mmol/L   CO2 22 22 - 32 mmol/L   Glucose, Bld 80 70 - 99 mg/dL    Comment: Glucose reference range applies only to samples taken after fasting for at least 8 hours.   BUN 11 6 - 20 mg/dL   Creatinine, Ser 0.71 0.44 - 1.00 mg/dL   Calcium 8.2 (L) 8.9 - 10.3 mg/dL   GFR, Estimated >60 >60 mL/min    Comment: (NOTE) Calculated using the CKD-EPI Creatinine Equation (2021)    Anion gap 5 5 - 15     Comment: Performed at [facility] [address] [place], [place] [address]  Prepare RBC (crossmatch)     Status: None   Collection Time: 06/14/22  7:38 AM  Result Value Ref Range   Order Confirmation      ORDER PROCESSED BY BLOOD BANK Performed at [facility], [place] [address], [place], [place] [address]    CT SOFT TISSUE NECK W CONTRAST  Result Date: 06/14/2022 CLINICAL DATA:  42 year old female was treated for abscess of tongue and left lower gum, completing full course of antibiotics 1 week ago. Increasing jaw pain. EXAM: CT NECK WITH CONTRAST TECHNIQUE: Multidetector CT imaging of the neck was performed using the standard protocol following the bolus administration of intravenous contrast. RADIATION DOSE REDUCTION: This exam was performed according to the departmental dose-optimization program which includes automated exposure control, adjustment of the mA and/or kV according to patient size and/or use of iterative reconstruction technique. CONTRAST:  42mL OMNIPAQUE IOHEXOL 350 MG/ML SOLN COMPARISON:  Noncontrast face  CT 07/23/2020. FINDINGS: Pharynx and larynx: Glottis is closed. Laryngeal and pharyngeal soft tissue contours are within normal limits. Parapharyngeal and retropharyngeal spaces are within normal limits. Salivary glands: Sublingual space, sublingual glands, and submandibular glands appear symmetric and within normal limits. Parotid glands are within normal limits. Oral tongue appears symmetric and within normal limits. Thyroid: Multinodular thyroid enlargement. By CT the largest nodules are up to 16 mm long axis. Lymph nodes: No lymphadenopathy identified. And no localized area of neck or face inflammation is evident. Masticator space soft tissue contours seem to remain normal. Vascular: Major vascular structures in the neck and at the skull base are patent. No atherosclerosis in the neck. Tortuous ICAs. Limited intracranial: Negative. Visualized orbits: Negative. Mastoids  and visualized paranasal sinuses: Left maxillary alveolar recess mucous retention cyst is small. Otherwise paranasal sinuses are clear. No sinus fluid levels. Tympanic cavities, mastoid air cells, and petrous apex air cells are also clear. Skeleton: Poor dentition. Multiple bilateral dental caries, predominantly of the mandibular dentition on the left and the maxillary dentition on the right. But no mandible fracture, and TMJ alignment appears maintained. Superimposed torus mandibularis (normal variant). No definite acute dental inflammatory process. Other visible osseous structures appear negative. Upper chest: Mild mass effect on the trachea from thyroid enlargement but no airway narrowing. Upper lungs are clear. Negative visible superior mediastinum aside from early retro sternal extension of the thyroid greater on the left. IMPRESSION: 1. Poor dentition. But no definite acute dental inflammatory process. And no oral cavity, lingual or neck abscess identified by CT. TMJ appear normally located. Dental consultation might be most valuable. 2. A 1.6 cm incidental thyroid nodule with heterogeneous and enlarged thyroid. Recommend non-emergent thyroid ultrasound. Reference: J Am Coll Radiol. 2015 Feb;12(2): 143-50 Electronically Signed   By: Genevie Ann M.D.   On: 06/14/2022 04:34    Pending Labs Unresulted Labs (From admission, onward)     Start     Ordered   06/14/22 1004  Pregnancy, urine  Once,   R        06/14/22 1003   06/14/22 0932  CBC  Once,   R       Comments: Post transfusion    06/14/22 0932   06/14/22 0739  TSH  Once,   R        06/14/22 0738   06/14/22 0333  HIV Antibody (routine testing w rflx)  (HIV Antibody (Routine testing w reflex) panel)  Once,   R        06/14/22 0339            Vitals/Pain Today's Vitals   06/14/22 0315 06/14/22 0745 06/14/22 0810 06/14/22 0825  BP: 138/67 (!) 144/78 (!) 148/92 131/83  Pulse: 64 60 64 60  Resp: (!) 23 (!) 24 12 13   Temp:  97.9 F (36.6 C)   98 F (36.7 C)  TempSrc:  Oral  Oral  SpO2: 98% 100% 100% 100%  Weight:      Height:      PainSc:  0-No pain      Isolation Precautions No active isolations  Medications Medications  acetaminophen (TYLENOL) tablet 650 mg (has no administration in time range)    Or  acetaminophen (TYLENOL) suppository 650 mg (has no administration in time range)  cefTRIAXone (ROCEPHIN) 2 g in sodium chloride 0.9 % 100 mL IVPB (0 g Intravenous Stopped 06/14/22 0504)  metroNIDAZOLE (FLAGYL) IVPB 500 mg (0 mg Intravenous Stopped 06/14/22 0528)  ferrous gluconate (FERGON) tablet 324  mg (324 mg Oral Given 06/14/22 0809)  norethindrone (AYGESTIN) tablet 5 mg (has no administration in time range)  0.9 %  sodium chloride infusion (Manually program via Guardrails IV Fluids) (0 mLs Intravenous Stopped 06/14/22 0252)  acetaminophen (TYLENOL) tablet 650 mg (650 mg Oral Given 06/13/22 2237)  ibuprofen (ADVIL) tablet 400 mg (400 mg Oral Given 06/13/22 2237)  penicillin v potassium (VEETID) tablet 500 mg (500 mg Oral Given 06/13/22 2237)  sodium chloride 0.9 % bolus 1,000 mL (0 mLs Intravenous Stopped 06/14/22 0528)  iohexol (OMNIPAQUE) 350 MG/ML injection 75 mL (75 mLs Intravenous Contrast Given 06/14/22 0417)  0.9 %  sodium chloride infusion (Manually program via Guardrails IV Fluids) ( Intravenous New Bag/Given 06/14/22 0818)    Mobility walks Low fall risk   Focused Assessments    R Recommendations: See Admitting Provider Note  Report given to:   Additional Notes:

## 2022-06-14 NOTE — ED Notes (Signed)
Pt in room A&O x4. Denies any pain at this time. Pt ambulated to restroom with a steady gait. Updated on plan of care. Room adjusted for comfort. Denies any other needs at this time.

## 2022-06-15 LAB — BPAM RBC
Blood Product Expiration Date: 202402032359
Blood Product Expiration Date: 202402032359
ISSUE DATE / TIME: 202401092319
ISSUE DATE / TIME: 202401100802
Unit Type and Rh: 5100
Unit Type and Rh: 5100

## 2022-06-15 LAB — HIV ANTIBODY (ROUTINE TESTING W REFLEX): HIV Screen 4th Generation wRfx: NONREACTIVE

## 2022-06-15 LAB — TYPE AND SCREEN
ABO/RH(D): O POS
Antibody Screen: NEGATIVE
Unit division: 0
Unit division: 0

## 2022-06-22 ENCOUNTER — Encounter (HOSPITAL_BASED_OUTPATIENT_CLINIC_OR_DEPARTMENT_OTHER): Payer: Self-pay | Admitting: Urology

## 2022-06-22 ENCOUNTER — Emergency Department (HOSPITAL_BASED_OUTPATIENT_CLINIC_OR_DEPARTMENT_OTHER)
Admission: EM | Admit: 2022-06-22 | Discharge: 2022-06-23 | Disposition: A | Discharge status: Home / Self Care | Payer: BC Managed Care – PPO | Attending: Emergency Medicine | Admitting: Emergency Medicine

## 2022-06-22 ENCOUNTER — Other Ambulatory Visit: Payer: Self-pay

## 2022-06-22 DIAGNOSIS — R220 Localized swelling, mass and lump, head: Secondary | ICD-10-CM

## 2022-06-22 DIAGNOSIS — K0889 Other specified disorders of teeth and supporting structures: Secondary | ICD-10-CM | POA: Diagnosis not present

## 2022-06-22 MED ORDER — CLINDAMYCIN HCL 150 MG PO CAPS
300.0000 mg | ORAL_CAPSULE | Freq: Once | ORAL | Status: AC
  Administered 2022-06-23: 300 mg via ORAL
  Filled 2022-06-22: qty 2

## 2022-06-22 MED ORDER — ACETAMINOPHEN 500 MG PO TABS
1000.0000 mg | ORAL_TABLET | Freq: Once | ORAL | Status: AC
  Administered 2022-06-23: 1000 mg via ORAL
  Filled 2022-06-22: qty 2

## 2022-06-22 NOTE — ED Triage Notes (Signed)
Pt seen here and prescribed amox, states right face swelling and left side of face painful x 3 days Seen on 1/9 for abscess and placed on amoxicillin again, finished it today  Pt states concern for another yeast infection  No acute swelling or obstruction noted

## 2022-06-22 NOTE — ED Provider Notes (Signed)
[facility] EMERGENCY DEPARTMENT Provider Note   CSN: [id number] Arrival date & time: 06/22/22  1932     History  Chief Complaint  Patient presents with   Facial Swelling   Vaginitis    [name] is a 41 y.o. female.  HPI Patient reports that she has recurrence of facial swelling on the left.  She was treated with amoxicillin for dental infection.  She reports it did get better but now seems worse again.  She has pain with opening and closing her jaw.  No fevers and no neck stiffness.    Home Medications Prior to Admission medications   Medication Sig Start Date End Date Taking? Authorizing Provider  clindamycin (CLEOCIN) 150 MG capsule Take 1 capsule (150 mg total) by mouth 3 (three) times daily. 06/22/22  Yes Charlesetta Shanks, MD  fluconazole (DIFLUCAN) 150 MG tablet Take 1 tablet (150 mg total) by mouth once for 1 dose. To be taking after completing your antibiotics if you have signs of yeast infection. 06/23/22 06/23/22 Yes Charlesetta Shanks, MD  acetaminophen (TYLENOL) 500 MG tablet Take 1,500 mg by mouth daily as needed for moderate pain.    [provider]  ferrous gluconate (FERGON) 324 MG tablet Take 1 tablet (324 mg total) by mouth daily with breakfast. 06/15/22   Regalado, Belkys A, MD  gabapentin (NEURONTIN) 300 MG capsule Take 1 capsule (300 mg total) by mouth 3 (three) times daily as needed for up to 30 doses. Patient taking differently: Take 300 mg by mouth daily as needed (for neuropathy). 02/24/21   Wyvonnia Dusky, MD  ibuprofen (ADVIL) 800 MG tablet Take 1 tablet (800 mg total) by mouth 3 (three) times daily. Patient taking differently: Take 800 mg by mouth in the morning and at bedtime. 05/19/22   Sherrill Raring, PA-C  norethindrone (AYGESTIN) 5 MG tablet Take 1 tablet (5 mg total) by mouth daily. 06/14/22   Regalado, Jerald Kief A, MD      Allergies    Amoxicillin and Hydrocodone    Review of Systems   Review of Systems  Physical Exam Updated  Vital Signs BP (!) 142/97 (BP Location: Left Arm)   Pulse 97   Temp 98.4 F (36.9 C) (Oral)   Resp 18   Ht 5\' 3"  (1.6 m)   Wt 67.3 kg   SpO2 99%   BMI 26.28 kg/m  Physical Exam Constitutional:      Comments: Patient resting quietly upon entering the room.  No acute distress.  Well-nourished well-developed.  HENT:     Head: Normocephalic and atraumatic.     Comments: Mild swelling on the left.  See attached images.  Patient endorses tenderness over the maxilla and the zygoma.  No overlying skin changes.    Nose: Nose normal.     Mouth/Throat:     Mouth: Mucous membranes are moist.     Pharynx: Oropharynx is clear.     Comments: Oral cavity is clear.  Mucous membranes are pink and moist.  Patient does have an area of dental decay at the inferior left with a fractured tooth.  There is no drainage or inflammation of the gum in this area.  No palpable fullness or abscess.  There are several other decayed and fractured teeth on the right side that are nontender. Eyes:     Extraocular Movements: Extraocular movements intact.     Conjunctiva/sclera: Conjunctivae normal.  Neck:     Comments: Neck is supple.  No significant lymphadenopathy. Cardiovascular:  Rate and Rhythm: Normal rate and regular rhythm.  Pulmonary:     Effort: Pulmonary effort is normal.     Breath sounds: Normal breath sounds.  Musculoskeletal:     Cervical back: Neck supple.  Skin:    General: Skin is warm and dry.  Neurological:     General: No focal deficit present.     Mental Status: She is oriented to person, place, and time.     ED Results / Procedures / Treatments   Labs (all labs ordered are listed, but only abnormal results are displayed) Labs Reviewed - No data to display  EKG None  Radiology No results found.  Procedures Procedures    Medications Ordered in ED Medications  clindamycin (CLEOCIN) capsule 300 mg (has no administration in time range)  acetaminophen (TYLENOL) tablet 1,000  mg (has no administration in time range)    ED Course/ Medical Decision Making/ A&P                             Medical Decision Making  Patient presents with complaint of dental pain.  She has mild swelling.  Patient previously had CT scan of the head and neck for dental pain and did not have any abscess.  Clinically there does not appear to be any evidence of interim development of abscess.  I suspect she has recurrent small apical abscesses or infection.  She had previously been treated with amoxicillin and reports that she has allergy to it.  At this time we will give a course of clindamycin.  Recommendations for extra strength Tylenol for pain.  Patient has history of recurrent chronic anemia.  I will not prescribe ibuprofen at this time for that reason.  Patient is stable however and clinically not showing signs of symptomatic anemia.  Patient has report of recurrent yeast infection with antibiotics.  Will empirically prescribe Diflucan.  Patient strongly recommended to follow-up with dental care to resolve the issue recurrent dental pain and swelling.        Final Clinical Impression(s) / ED Diagnoses Final diagnoses:  Pain, dental  Facial swelling    Rx / DC Orders ED Discharge Orders          Ordered    fluconazole (DIFLUCAN) 150 MG tablet   Once        06/23/22 0000    clindamycin (CLEOCIN) 150 MG capsule  3 times daily        06/23/22 0000              Charlesetta Shanks, MD 06/23/22 0006

## 2022-06-23 MED ORDER — FLUCONAZOLE 150 MG PO TABS: 150.0000 mg | ORAL_TABLET | Freq: Once | ORAL | 0 refills | Status: AC

## 2022-06-23 MED ORDER — CLINDAMYCIN HCL 150 MG PO CAPS: 150.0000 mg | ORAL_CAPSULE | Freq: Three times a day (TID) | ORAL | 0 refills | Status: DC

## 2022-06-23 NOTE — Discharge Instructions (Signed)
1.  You have been given a prescription for clindamycin.  You get your first dose in the emergency department.  Start this tomorrow as prescribed. 2.  You have been given a prescription for Diflucan.  This is for yeast infection.  You may start this after you have completed the clindamycin if you have signs of a yeast infection. 3.  You may take extra strength Tylenol every 6 hours for pain.  You must call a dentist to get a follow-up as soon as possible.  Without dental care you will likely experience recurrences of abscesses or dental pain.

## 2022-06-23 NOTE — ED Notes (Signed)
D/c paperwork reviewed with pt, including prescriptions and follow up care.  No questions or concerns voiced at time of d/c. . Pt verbalized understanding, Ambulatory without assistance to ED exit, NAD.   

## 2022-07-10 ENCOUNTER — Other Ambulatory Visit (HOSPITAL_COMMUNITY)
Admission: RE | Admit: 2022-07-10 | Discharge: 2022-07-10 | Disposition: A | Discharge status: Home / Self Care | Payer: BC Managed Care – PPO | Source: Ambulatory Visit | Attending: Obstetrics and Gynecology | Admitting: Obstetrics and Gynecology

## 2022-07-10 ENCOUNTER — Encounter: Payer: Self-pay | Admitting: Obstetrics and Gynecology

## 2022-07-10 ENCOUNTER — Encounter: Payer: BC Managed Care – PPO | Admitting: Obstetrics and Gynecology

## 2022-07-10 ENCOUNTER — Ambulatory Visit (INDEPENDENT_AMBULATORY_CARE_PROVIDER_SITE_OTHER): Payer: BC Managed Care – PPO | Admitting: Obstetrics and Gynecology

## 2022-07-10 VITALS — BP 134/87 | HR 98

## 2022-07-10 DIAGNOSIS — N939 Abnormal uterine and vaginal bleeding, unspecified: Secondary | ICD-10-CM

## 2022-07-10 MED ORDER — NORETHINDRONE ACETATE 5 MG PO TABS: 10.0000 mg | ORAL_TABLET | Freq: Every day | ORAL | 2 refills | Status: DC

## 2022-07-10 NOTE — Progress Notes (Signed)
NEW GYNECOLOGY PATIENT Patient name: [name] MRN [id number]  Date of birth: Aug 17, 1980 Chief Complaint:   Follow-up     History:  [name] is a 42 y.o. G3P3003 being seen today for AUB.  Irregular heavy bleeding; bleeding since January. Changes overnight pads hourly. Has bleeding for most of the month for an unknown amount of time. Has cramping with the bleeding. Doesn't like to take medications daily but willing.  Not sexually active with husband due to bleeding. Not sure she has completed childbearing.  Likely has had abnormal bleeding for years.  No breast or nipple changes, no nipple discharge.  Reports that Aygestin is not helping much currently.  She is a current smoker - wants to quit but doesn't want cessation counseling referral  Denies urinary changes Tried depo - gained weight.   Does not presently have insurance, reports has enrolled in waiting for her insurance to kick in.   Gynecologic History No LMP recorded. Contraception: abstinence Last Pap: No results found for: "DIAGPAP", "[place]", "ADEQPAP"  Obstetric History OB History  Gravida Para Term Preterm AB Living  3 3 3     3   SAB IAB Ectopic Multiple Live Births               # Outcome Date GA Lbr Len/2nd Weight Sex Delivery Anes PTL Lv  3 Term           2 Term           1 Term             Past Medical History:  Diagnosis Date   Anemia     Past Surgical History:  Procedure Laterality Date   CESAREAN SECTION     OVARIAN CYST REMOVAL      Current Outpatient Medications on File Prior to Visit  Medication Sig Dispense Refill   acetaminophen (TYLENOL) 500 MG tablet Take 1,500 mg by mouth daily as needed for moderate pain.     ferrous gluconate (FERGON) 324 MG tablet Take 1 tablet (324 mg total) by mouth daily with breakfast. 30 tablet 3   gabapentin (NEURONTIN) 300 MG capsule Take 1 capsule (300 mg total) by mouth 3 (three) times daily as needed for up to 30 doses. (Patient taking differently:  Take 300 mg by mouth daily as needed (for neuropathy).) 30 capsule 0   ibuprofen (ADVIL) 800 MG tablet Take 1 tablet (800 mg total) by mouth 3 (three) times daily. (Patient taking differently: Take 800 mg by mouth in the morning and at bedtime.) 21 tablet 0   norethindrone (AYGESTIN) 5 MG tablet Take 1 tablet (5 mg total) by mouth daily. 30 tablet 1   clindamycin (CLEOCIN) 150 MG capsule Take 1 capsule (150 mg total) by mouth 3 (three) times daily. (Patient not taking: Reported on 07/10/2022) 15 capsule 0   No current facility-administered medications on file prior to visit.    Allergies  Allergen Reactions   Amoxicillin    Hydrocodone Itching    Tongue itches    Social History:  reports that she has been smoking cigarettes and cigars. She has been smoking an average of .5 packs per day. She has never used smokeless tobacco. She reports current alcohol use. She reports current drug use. Drug: Marijuana.  Family History  Problem Relation Age of Onset   Hypertension Mother    Diabetes Mother    Hypertension Father    Breast cancer Maternal Grandmother     The following portions of  the patient's history were reviewed and updated as appropriate: allergies, current medications, past family history, past medical history, past social history, past surgical history and problem list.  Review of Systems Pertinent items noted in HPI and remainder of comprehensive ROS otherwise negative.  Physical Exam:  BP 134/87   Pulse 98  Physical Exam Vitals and nursing note reviewed.  Constitutional:      Appearance: Normal appearance.  Cardiovascular:     Rate and Rhythm: Normal rate.  Pulmonary:     Effort: Pulmonary effort is normal.     Breath sounds: Normal breath sounds.  Abdominal:     Tenderness: There is abdominal tenderness.     Comments: Mild, nonspecific tenderness throughout lower abdomen  no rebound  no guarding  Neurological:     General: No focal deficit present.     Mental  Status: She is alert and oriented to person, place, and time.  Psychiatric:        Mood and Affect: Mood normal.        Behavior: Behavior normal.        Thought Content: Thought content normal.        Judgment: Judgment normal.        Assessment and Plan:   1. Abnormal uterine bleeding (AUB) Patient has abnormal uterine bleeding .  Unclear etiology.  TSH normal last month, also had normal glucose on BMP, low suspicion for DM.  Has CT scan from 2021 with normal-appearing uterus, recommend pelvic ultrasound.  Patient currently does not have insurance, will place order for pelvic ultrasound patient will schedule once insurance coverage has started.  Will increase dose of Aygestin to 10 mg daily.  Patient will follow-up via MyChart or in person in about 1 to 2 months.  Also obtain self swab for vaginitis due to report of discharge following use of antibiotics -states given 1 pill of Diflucan which has not helped. Reviewed additional options for AUB management including POPs, depo, lng-IUD as well as procedures for when completed childbearing.  CHC contraindications present.   - norethindrone (AYGESTIN) 5 MG tablet; Take 2 tablets (10 mg total) by mouth daily.  Dispense: 60 tablet; Refill: 2 - US PELVIC COMPLETE WITH TRANSVAGINAL; Future - Cervicovaginal ancillary only    Routine preventative health maintenance measures emphasized. Please refer to After Visit Summary for other counseling recommendations.   Follow-up: No follow-ups on file.      Darliss Cheney, MD Obstetrician & Gynecologist, [facility] [employer] [facility], [place]

## 2022-07-10 NOTE — Progress Notes (Deleted)
NEW GYNECOLOGY PATIENT Patient name: [name] MRN [id number]  Date of birth: 07-Apr-1981 Chief Complaint:   No chief complaint on file.     History:  [name] is a 42 y.o. G3P3003 being seen today for ***.    HPI   ROS      Gynecologic History No LMP recorded. Contraception: {method:5051} Last Pap: ***. Result was {norm/abn:16337} with negative HPV Last Mammogram: ***.  Result was {norm/abn:16337} Last Colonoscopy: ***.  Result was {norm/abn:16337}  Obstetric History OB History  Gravida Para Term Preterm AB Living  '3 3 3     3  '$ SAB IAB Ectopic Multiple Live Births               # Outcome Date GA Lbr Len/2nd Weight Sex Delivery Anes PTL Lv  3 Term           2 Term           1 Term             Past Medical History:  Diagnosis Date   Anemia     Past Surgical History:  Procedure Laterality Date   CESAREAN SECTION     OVARIAN CYST REMOVAL      Current Outpatient Medications on File Prior to Visit  Medication Sig Dispense Refill   acetaminophen (TYLENOL) 500 MG tablet Take 1,500 mg by mouth daily as needed for moderate pain.     clindamycin (CLEOCIN) 150 MG capsule Take 1 capsule (150 mg total) by mouth 3 (three) times daily. 15 capsule 0   ferrous gluconate (FERGON) 324 MG tablet Take 1 tablet (324 mg total) by mouth daily with breakfast. 30 tablet 3   gabapentin (NEURONTIN) 300 MG capsule Take 1 capsule (300 mg total) by mouth 3 (three) times daily as needed for up to 30 doses. (Patient taking differently: Take 300 mg by mouth daily as needed (for neuropathy).) 30 capsule 0   ibuprofen (ADVIL) 800 MG tablet Take 1 tablet (800 mg total) by mouth 3 (three) times daily. (Patient taking differently: Take 800 mg by mouth in the morning and at bedtime.) 21 tablet 0   norethindrone (AYGESTIN) 5 MG tablet Take 1 tablet (5 mg total) by mouth daily. 30 tablet 1   No current facility-administered medications on file prior to visit.    Allergies  Allergen  Reactions   Amoxicillin    Hydrocodone Itching    Tongue itches    Social History:  reports that she has been smoking cigarettes and cigars. She has been smoking an average of .5 packs per day. She has never used smokeless tobacco. She reports current alcohol use. She reports current drug use. Drug: Marijuana.  Family History  Problem Relation Age of Onset   Hypertension Mother    Diabetes Mother    Hypertension Father    Breast cancer Maternal Grandmother     The following portions of the patient's history were reviewed and updated as appropriate: allergies, current medications, past family history, past medical history, past social history, past surgical history and problem list.  Review of Systems Pertinent items noted in HPI and remainder of comprehensive ROS otherwise negative.  Physical Exam:  There were no vitals taken for this visit. Physical Exam     Assessment and Plan:   There are no diagnoses linked to this encounter.   Routine preventative health maintenance measures emphasized. Please refer to After Visit Summary for other counseling recommendations.   Follow-up: No follow-ups on file.  Darliss Cheney, MD Obstetrician & Gynecologist, Faculty [facility] [employer], [place]

## 2022-07-10 NOTE — Patient Instructions (Addendum)
Schedule your ultrasound when you have a chance  You are going to take 2 tablets (10mg ) of Aygestin daily. Follow up in about 1 - 2 months unless you want to switch to something else sooner. If you are doing well with it, you can send a MyChart.    Referral to [facility] for pap and mammmogram   [phone] - centralized scheduling to make an ultrasound appointment

## 2022-07-11 ENCOUNTER — Other Ambulatory Visit: Payer: Self-pay | Admitting: Obstetrics and Gynecology

## 2022-07-11 DIAGNOSIS — B9689 Other specified bacterial agents as the cause of diseases classified elsewhere: Secondary | ICD-10-CM

## 2022-07-11 LAB — CERVICOVAGINAL ANCILLARY ONLY
Bacterial Vaginitis (gardnerella): POSITIVE — AB
Candida Glabrata: NEGATIVE
Candida Vaginitis: NEGATIVE
Comment: NEGATIVE
Comment: NEGATIVE
Comment: NEGATIVE

## 2022-07-11 MED ORDER — METRONIDAZOLE 500 MG PO TABS: 500.0000 mg | ORAL_TABLET | Freq: Two times a day (BID) | ORAL | 0 refills | Status: AC

## 2022-07-20 ENCOUNTER — Telehealth: Payer: Self-pay

## 2022-07-20 NOTE — Telephone Encounter (Signed)
Telephoned patient at home number. Left a voice message with [facility] contact information. 

## 2022-07-30 ENCOUNTER — Emergency Department (HOSPITAL_BASED_OUTPATIENT_CLINIC_OR_DEPARTMENT_OTHER)
Admission: EM | Admit: 2022-07-30 | Discharge: 2022-07-30 | Disposition: A | Discharge status: Home / Self Care | Payer: BC Managed Care – PPO | Attending: Emergency Medicine | Admitting: Emergency Medicine

## 2022-07-30 ENCOUNTER — Emergency Department (HOSPITAL_BASED_OUTPATIENT_CLINIC_OR_DEPARTMENT_OTHER): Payer: BC Managed Care – PPO

## 2022-07-30 ENCOUNTER — Other Ambulatory Visit: Payer: Self-pay

## 2022-07-30 ENCOUNTER — Encounter (HOSPITAL_BASED_OUTPATIENT_CLINIC_OR_DEPARTMENT_OTHER): Payer: Self-pay | Admitting: Emergency Medicine

## 2022-07-30 DIAGNOSIS — K529 Noninfective gastroenteritis and colitis, unspecified: Secondary | ICD-10-CM | POA: Insufficient documentation

## 2022-07-30 DIAGNOSIS — R1030 Lower abdominal pain, unspecified: Secondary | ICD-10-CM | POA: Diagnosis present

## 2022-07-30 LAB — URINALYSIS, ROUTINE W REFLEX MICROSCOPIC
Bilirubin Urine: NEGATIVE
Glucose, UA: NEGATIVE mg/dL
Ketones, ur: NEGATIVE mg/dL
Leukocytes,Ua: NEGATIVE
Nitrite: NEGATIVE
Protein, ur: NEGATIVE mg/dL
Specific Gravity, Urine: 1.025 (ref 1.005–1.030)
pH: 6.5 (ref 5.0–8.0)

## 2022-07-30 LAB — COMPREHENSIVE METABOLIC PANEL
ALT: 10 U/L (ref 0–44)
AST: 17 U/L (ref 15–41)
Albumin: 3.5 g/dL (ref 3.5–5.0)
Alkaline Phosphatase: 40 U/L (ref 38–126)
Anion gap: 5 (ref 5–15)
BUN: 11 mg/dL (ref 6–20)
CO2: 22 mmol/L (ref 22–32)
Calcium: 8.4 mg/dL — ABNORMAL LOW (ref 8.9–10.3)
Chloride: 108 mmol/L (ref 98–111)
Creatinine, Ser: 0.96 mg/dL (ref 0.44–1.00)
GFR, Estimated: >60 mL/min (ref >60)
Glucose, Bld: 84 mg/dL (ref 70–99)
Potassium: 4 mmol/L (ref 3.5–5.1)
Sodium: 135 mmol/L (ref 135–145)
Total Bilirubin: 0.5 mg/dL (ref 0.3–1.2)
Total Protein: 7.6 g/dL (ref 6.5–8.1)

## 2022-07-30 LAB — URINALYSIS, MICROSCOPIC (REFLEX)

## 2022-07-30 LAB — CBC
HCT: 38.3 % (ref 36.0–46.0)
Hemoglobin: 11.7 g/dL — ABNORMAL LOW (ref 12.0–15.0)
MCH: 21.3 pg — ABNORMAL LOW (ref 26.0–34.0)
MCHC: 30.5 g/dL (ref 30.0–36.0)
MCV: 69.6 fL — ABNORMAL LOW (ref 80.0–100.0)
Platelets: 479 10*3/uL — ABNORMAL HIGH (ref 150–400)
RBC: 5.5 MIL/uL — ABNORMAL HIGH (ref 3.87–5.11)
WBC: 5.9 10*3/uL (ref 4.0–10.5)
nRBC: 0 % (ref 0.0–0.2)

## 2022-07-30 LAB — HCG, SERUM, QUALITATIVE: Preg, Serum: NEGATIVE

## 2022-07-30 LAB — OCCULT BLOOD X 1 CARD TO LAB, STOOL: Fecal Occult Bld: POSITIVE — AB

## 2022-07-30 MED ORDER — CIPROFLOXACIN HCL 500 MG PO TABS: 500.0000 mg | ORAL_TABLET | Freq: Two times a day (BID) | ORAL | 0 refills | Status: AC

## 2022-07-30 MED ORDER — SODIUM CHLORIDE 0.9 % IV BOLUS
1000.0000 mL | Freq: Once | INTRAVENOUS | Status: AC
  Administered 2022-07-30: 1000 mL via INTRAVENOUS

## 2022-07-30 MED ORDER — FENTANYL CITRATE PF 50 MCG/ML IJ SOSY
50.0000 ug | PREFILLED_SYRINGE | Freq: Once | INTRAMUSCULAR | Status: AC
  Administered 2022-07-30: 50 ug via INTRAVENOUS
  Filled 2022-07-30: qty 1

## 2022-07-30 MED ORDER — IOHEXOL 300 MG/ML  SOLN
80.0000 mL | Freq: Once | INTRAMUSCULAR | Status: AC | PRN
  Administered 2022-07-30: 80 mL via INTRAVENOUS

## 2022-07-30 MED ORDER — METRONIDAZOLE 500 MG PO TABS: 500.0000 mg | ORAL_TABLET | Freq: Two times a day (BID) | ORAL | 0 refills | Status: DC

## 2022-07-30 NOTE — ED Triage Notes (Signed)
Pt c/o blood in urine and stool this morning. Pt had a blood transfusion in January.

## 2022-07-30 NOTE — ED Provider Notes (Signed)
[facility] Provider Note   CSN: [id number] Arrival date & time: 07/30/22  1056     History  Chief Complaint  Patient presents with   Abdominal Pain   Blood In Stools    [name] is a 42 y.o. female.  Patient here with blood in her stool, lower abdominal cramping for the last day or 2.  She has history of iron deficiency and is on iron pills.  She has had what she thinks is some blood in her stool last day or 2 with severe lower abdominal cramping.  She questions if may be blood in the urine.  She is not having any vaginal bleeding.  She has needed transfusions in the past due to iron deficiency anemia.  She has been on and off antibiotics recently.  Was put on clindamycin fairly recently.  Denies any chest pain or shortness of breath.  Nothing makes it worse or better.  The history is provided by the patient.       Home Medications Prior to Admission medications   Medication Sig Start Date End Date Taking? Authorizing Provider  ciprofloxacin (CIPRO) 500 MG tablet Take 1 tablet (500 mg total) by mouth every 12 (twelve) hours for 7 days. 07/30/22 08/06/22 Yes Klarisa Barman, DO  metroNIDAZOLE (FLAGYL) 500 MG tablet Take 1 tablet (500 mg total) by mouth 2 (two) times daily. 07/30/22  Yes Kynsli Haapala, DO  acetaminophen (TYLENOL) 500 MG tablet Take 1,500 mg by mouth daily as needed for moderate pain.    [provider]  clindamycin (CLEOCIN) 150 MG capsule Take 1 capsule (150 mg total) by mouth 3 (three) times daily. Patient not taking: Reported on 07/10/2022 06/22/22   Charlesetta Shanks, MD  ferrous gluconate (FERGON) 324 MG tablet Take 1 tablet (324 mg total) by mouth daily with breakfast. 06/15/22   Regalado, Belkys A, MD  gabapentin (NEURONTIN) 300 MG capsule Take 1 capsule (300 mg total) by mouth 3 (three) times daily as needed for up to 30 doses. Patient taking differently: Take 300 mg by mouth daily as needed (for  neuropathy). 02/24/21   Wyvonnia Dusky, MD  ibuprofen (ADVIL) 800 MG tablet Take 1 tablet (800 mg total) by mouth 3 (three) times daily. Patient taking differently: Take 800 mg by mouth in the morning and at bedtime. 05/19/22   Sherrill Raring, PA-C  norethindrone (AYGESTIN) 5 MG tablet Take 2 tablets (10 mg total) by mouth daily. 07/10/22   Darliss Cheney, MD      Allergies    Amoxicillin and Hydrocodone    Review of Systems   Review of Systems  Physical Exam Updated Vital Signs BP (!) 141/103 (BP Location: Right Arm)   Pulse 80   Temp 97.8 F (36.6 C) (Oral)   Resp 16   Ht 5' 3.5" (1.613 m)   Wt 65.8 kg   LMP 07/30/2022   SpO2 100%   BMI 25.28 kg/m  Physical Exam Vitals and nursing note reviewed. Exam conducted with a chaperone present.  Constitutional:      General: She is not in acute distress.    Appearance: She is well-developed. She is not ill-appearing.  HENT:     Head: Normocephalic and atraumatic.     Nose: Nose normal.     Mouth/Throat:     Mouth: Mucous membranes are moist.  Eyes:     Extraocular Movements: Extraocular movements intact.     Conjunctiva/sclera: Conjunctivae normal.  Pupils: Pupils are equal, round, and reactive to light.  Cardiovascular:     Rate and Rhythm: Normal rate and regular rhythm.     Pulses: Normal pulses.     Heart sounds: Normal heart sounds. No murmur heard. Pulmonary:     Effort: Pulmonary effort is normal. No respiratory distress.     Breath sounds: Normal breath sounds.  Abdominal:     General: Abdomen is flat.     Palpations: Abdomen is soft.     Tenderness: There is abdominal tenderness.  Genitourinary:    Comments: Stool mostly brown with some blood Musculoskeletal:        General: No swelling.     Cervical back: Normal range of motion and neck supple.  Skin:    General: Skin is warm and dry.     Capillary Refill: Capillary refill takes less than 2 seconds.  Neurological:     General: No focal deficit  present.     Mental Status: She is alert.  Psychiatric:        Mood and Affect: Mood normal.     ED Results / Procedures / Treatments   Labs (all labs ordered are listed, but only abnormal results are displayed) Labs Reviewed  URINALYSIS, ROUTINE W REFLEX MICROSCOPIC - Abnormal; Notable for the following components:      Result Value   Hgb urine dipstick MODERATE (*)    All other components within normal limits  CBC - Abnormal; Notable for the following components:   RBC 5.50 (*)    Hemoglobin 11.7 (*)    MCV 69.6 (*)    MCH 21.3 (*)    Platelets 479 (*)    All other components within normal limits  COMPREHENSIVE METABOLIC PANEL - Abnormal; Notable for the following components:   Calcium 8.4 (*)    All other components within normal limits  OCCULT BLOOD X 1 CARD TO LAB, STOOL - Abnormal; Notable for the following components:   Fecal Occult Bld POSITIVE (*)    All other components within normal limits  URINALYSIS, MICROSCOPIC (REFLEX) - Abnormal; Notable for the following components:   Bacteria, UA MANY (*)    All other components within normal limits  C DIFFICILE QUICK SCREEN W PCR REFLEX    HCG, SERUM, QUALITATIVE    EKG None  Radiology CT ABDOMEN PELVIS W CONTRAST  Result Date: 07/30/2022 CLINICAL DATA:  42 year old female with acute abdominal and pelvic pain and blood in stools. EXAM: CT ABDOMEN AND PELVIS WITH CONTRAST TECHNIQUE: Multidetector CT imaging of the abdomen and pelvis was performed using the standard protocol following bolus administration of intravenous contrast. RADIATION DOSE REDUCTION: This exam was performed according to the departmental dose-optimization program which includes automated exposure control, adjustment of the mA and/or kV according to patient size and/or use of iterative reconstruction technique. CONTRAST:  72m OMNIPAQUE IOHEXOL 300 MG/ML  SOLN COMPARISON:  02/04/2021 CT FINDINGS: Lower chest: No acute abnormality Hepatobiliary: The liver  and gallbladder are unremarkable. There is no evidence of intrahepatic or extrahepatic biliary dilatation. Pancreas: Unremarkable Spleen: Unremarkable Adrenals/Urinary Tract: A 3.5 cm indeterminate RIGHT adrenal mass is unchanged. The kidneys, LEFT adrenal gland and bladder are unremarkable except for a stable LEFT renal cyst which no follow-up imaging is recommended. Stomach/Bowel: There is diffuse circumferential wall thickening of the majority of the colon and terminal ileum. There is no evidence of bowel obstruction, pneumoperitoneum or abscess. No other definite bowel abnormalities are noted. Vascular/Lymphatic: No significant vascular findings are present. No  enlarged abdominal or pelvic lymph nodes. Reproductive: Heterogeneous uterus is noted likely representing uterine fibroids. No adnexal masses are noted. Other: No ascites, focal collection or pneumoperitoneum. Musculoskeletal: No acute or suspicious bony abnormalities are noted. IMPRESSION: 1. Diffuse circumferential wall thickening of the majority of the colon and terminal ileum, compatible with an infectious or inflammatory enterocolitis. No evidence of bowel obstruction, pneumoperitoneum or abscess. 2. Unchanged 3.5 cm indeterminate RIGHT adrenal mass. Recommend further evaluation with nonemergent MRI with and without contrast. 3. Heterogeneous uterus-likely uterine fibroids. Electronically Signed   By: Margarette Canada M.D.   On: 07/30/2022 12:53    Procedures Procedures    Medications Ordered in ED Medications  sodium chloride 0.9 % bolus 1,000 mL (1,000 mLs Intravenous New Bag/Given 07/30/22 1125)  fentaNYL (SUBLIMAZE) injection 50 mcg (50 mcg Intravenous Given 07/30/22 1123)  iohexol (OMNIPAQUE) 300 MG/ML solution 80 mL (80 mLs Intravenous Contrast Given 07/30/22 1226)    ED Course/ Medical Decision Making/ A&P                             Medical Decision Making Amount and/or Complexity of Data Reviewed Labs: ordered. Radiology:  ordered.  Risk Prescription drug management.   [name] is here with abdominal pain, blood in her stool.  History of anemia on iron.  She has had diarrhea with some blood in it today.  She has normal vitals.  She is got some lower abdominal pain.  She has had a lot of cramping abdominal pain.  Some recent antibiotic use.  Differential diagnosis likely colitis versus possibly C. difficile versus may be polyp or hemorrhoid.  Does not sound like she is having blood from her vagina or from her urine.  Seems less likely that this would be a urinary tract infection or kidney stone.  Ultimately will check CBC, CMP, CT scan abdomen pelvis.  Will give IV fluids, IV fentanyl.  Hemoccult will be positive stool does not appear melanotic, does not sound like large-volume blood or blood clots.  Will try to send off a stool sample, C. difficile sample.  Per my review and interpretation of labs is no significant leukocytosis.  No fever.  Hemoglobin is 11.7.  Lab work otherwise unremarkable.  CT scan consistent with infectious or inflammatory enterocolitis.  I suspect that this is likely inflammatory as she does not have a fever or white count but will treat with Cipro and Flagyl.  This seems less likely now to be C. difficile.  She has not had any episodes of diarrhea while being here.  Will have her consider following up with her primary care doctor to give a sample.  Will refer her to GI in case this is an inflammatory bowel process.  Discharged in good condition.  This chart was dictated using voice recognition software.  Despite best efforts to proofread,  errors can occur which can change the documentation meaning.         Final Clinical Impression(s) / ED Diagnoses Final diagnoses:  Colitis    Rx / DC Orders ED Discharge Orders          Ordered    ciprofloxacin (CIPRO) 500 MG tablet  Every 12 hours        07/30/22 1309    metroNIDAZOLE (FLAGYL) 500 MG tablet  2 times daily        07/30/22  1309  Lennice Sites, DO 07/30/22 1310

## 2022-07-30 NOTE — ED Provider Notes (Incomplete)
  [facility] Provider Note   CSN: [id number] Arrival date & time: 07/30/22  1056     History {Add pertinent medical, surgical, social history, OB history to HPI:1} Chief Complaint  Patient presents with   Hematuria    [name] is a 42 y.o. female.  HPI     Home Medications Prior to Admission medications   Medication Sig Start Date End Date Taking? Authorizing Provider  acetaminophen (TYLENOL) 500 MG tablet Take 1,500 mg by mouth daily as needed for moderate pain.    [provider]  clindamycin (CLEOCIN) 150 MG capsule Take 1 capsule (150 mg total) by mouth 3 (three) times daily. Patient not taking: Reported on 07/10/2022 06/22/22   Charlesetta Shanks, MD  ferrous gluconate (FERGON) 324 MG tablet Take 1 tablet (324 mg total) by mouth daily with breakfast. 06/15/22   Regalado, Belkys A, MD  gabapentin (NEURONTIN) 300 MG capsule Take 1 capsule (300 mg total) by mouth 3 (three) times daily as needed for up to 30 doses. Patient taking differently: Take 300 mg by mouth daily as needed (for neuropathy). 02/24/21   Wyvonnia Dusky, MD  ibuprofen (ADVIL) 800 MG tablet Take 1 tablet (800 mg total) by mouth 3 (three) times daily. Patient taking differently: Take 800 mg by mouth in the morning and at bedtime. 05/19/22   Sherrill Raring, PA-C  norethindrone (AYGESTIN) 5 MG tablet Take 2 tablets (10 mg total) by mouth daily. 07/10/22   Darliss Cheney, MD      Allergies    Amoxicillin and Hydrocodone    Review of Systems   Review of Systems  Physical Exam Updated Vital Signs BP (!) 141/103 (BP Location: Right Arm)   Pulse 80   Temp 97.8 F (36.6 C) (Oral)   Resp 16   Ht 5' 3.5" (1.613 m)   Wt 65.8 kg   LMP 07/30/2022   SpO2 100%   BMI 25.28 kg/m  Physical Exam  ED Results / Procedures / Treatments   Labs (all labs ordered are listed, but only abnormal results are displayed) Labs Reviewed  URINALYSIS, ROUTINE W REFLEX  MICROSCOPIC  PREGNANCY, URINE  CBC  COMPREHENSIVE METABOLIC PANEL    EKG None  Radiology No results found.  Procedures Procedures  {Document cardiac monitor, telemetry assessment procedure when appropriate:1}  Medications Ordered in ED Medications - No data to display  ED Course/ Medical Decision Making/ A&P   {   Click here for ABCD2, HEART and other calculatorsREFRESH Note before signing :1}                          Medical Decision Making Amount and/or Complexity of Data Reviewed Labs: ordered.   ***  {Document critical care time when appropriate:1} {Document review of labs and clinical decision tools ie heart score, Chads2Vasc2 etc:1}  {Document your independent review of radiology images, and any outside records:1} {Document your discussion with family members, caretakers, and with consultants:1} {Document social determinants of health affecting pt's care:1} {Document your decision making why or why not admission, treatments were needed:1} Final Clinical Impression(s) / ED Diagnoses Final diagnoses:  None    Rx / DC Orders ED Discharge Orders     None

## 2022-08-02 NOTE — Telephone Encounter (Signed)
Patient called to schedule appointment with [facility]. She is expecting [employer] card in the mail. Explained to patient [facility] provides service to patients without insurance, and to call back if she does not receive the card.

## 2022-08-04 ENCOUNTER — Encounter: Payer: Self-pay | Admitting: Gastroenterology

## 2022-08-21 ENCOUNTER — Encounter: Payer: Self-pay | Admitting: Gastroenterology

## 2022-08-21 DIAGNOSIS — K529 Noninfective gastroenteritis and colitis, unspecified: Secondary | ICD-10-CM

## 2022-08-30 ENCOUNTER — Other Ambulatory Visit: Payer: Self-pay

## 2022-08-30 ENCOUNTER — Encounter (HOSPITAL_COMMUNITY): Payer: Self-pay

## 2022-08-30 ENCOUNTER — Emergency Department (HOSPITAL_COMMUNITY)
Admission: EM | Admit: 2022-08-30 | Discharge: 2022-08-30 | Disposition: A | Discharge status: Home / Self Care | Payer: BC Managed Care – PPO | Attending: Emergency Medicine | Admitting: Emergency Medicine

## 2022-08-30 ENCOUNTER — Emergency Department (HOSPITAL_COMMUNITY): Payer: BC Managed Care – PPO

## 2022-08-30 DIAGNOSIS — J189 Pneumonia, unspecified organism: Secondary | ICD-10-CM | POA: Insufficient documentation

## 2022-08-30 DIAGNOSIS — R079 Chest pain, unspecified: Secondary | ICD-10-CM | POA: Diagnosis present

## 2022-08-30 DIAGNOSIS — Z1152 Encounter for screening for COVID-19: Secondary | ICD-10-CM | POA: Diagnosis not present

## 2022-08-30 LAB — CBC WITH DIFFERENTIAL/PLATELET
Abs Immature Granulocytes: 0.01 10*3/uL (ref 0.00–0.07)
Basophils Absolute: 0.1 10*3/uL (ref 0.0–0.1)
Basophils Relative: 1 %
Eosinophils Absolute: 0.2 10*3/uL (ref 0.0–0.5)
Eosinophils Relative: 3 %
HCT: 40.2 % (ref 36.0–46.0)
Hemoglobin: 12.1 g/dL (ref 12.0–15.0)
Immature Granulocytes: 0 %
Lymphocytes Relative: 20 %
Lymphs Abs: 1.1 10*3/uL (ref 0.7–4.0)
MCH: 23 pg — ABNORMAL LOW (ref 26.0–34.0)
MCHC: 30.1 g/dL (ref 30.0–36.0)
MCV: 76.6 fL — ABNORMAL LOW (ref 80.0–100.0)
Monocytes Absolute: 0.3 10*3/uL (ref 0.1–1.0)
Monocytes Relative: 6 %
Neutro Abs: 3.7 10*3/uL (ref 1.7–7.7)
Neutrophils Relative %: 70 %
Platelets: 419 10*3/uL — ABNORMAL HIGH (ref 150–400)
RBC: 5.25 MIL/uL — ABNORMAL HIGH (ref 3.87–5.11)
RDW: 19.9 % — ABNORMAL HIGH (ref 11.5–15.5)
WBC: 5.4 10*3/uL (ref 4.0–10.5)
nRBC: 0 % (ref 0.0–0.2)

## 2022-08-30 LAB — COMPREHENSIVE METABOLIC PANEL
ALT: 10 U/L (ref 0–44)
AST: 16 U/L (ref 15–41)
Albumin: 3.3 g/dL — ABNORMAL LOW (ref 3.5–5.0)
Alkaline Phosphatase: 47 U/L (ref 38–126)
Anion gap: 7 (ref 5–15)
BUN: 8 mg/dL (ref 6–20)
CO2: 25 mmol/L (ref 22–32)
Calcium: 8.7 mg/dL — ABNORMAL LOW (ref 8.9–10.3)
Chloride: 106 mmol/L (ref 98–111)
Creatinine, Ser: 0.97 mg/dL (ref 0.44–1.00)
GFR, Estimated: >60 mL/min (ref >60)
Glucose, Bld: 101 mg/dL — ABNORMAL HIGH (ref 70–99)
Potassium: 4 mmol/L (ref 3.5–5.1)
Sodium: 138 mmol/L (ref 135–145)
Total Bilirubin: 0.6 mg/dL (ref 0.3–1.2)
Total Protein: 7 g/dL (ref 6.5–8.1)

## 2022-08-30 LAB — D-DIMER, QUANTITATIVE: D-Dimer, Quant: <0.27 ug/mL-FEU (ref 0.00–0.50)

## 2022-08-30 LAB — TROPONIN I (HIGH SENSITIVITY): Troponin I (High Sensitivity): <2 ng/L (ref <18)

## 2022-08-30 LAB — SARS CORONAVIRUS 2 BY RT PCR: SARS Coronavirus 2 by RT PCR: NEGATIVE

## 2022-08-30 MED ORDER — LEVOFLOXACIN 500 MG PO TABS
500.0000 mg | ORAL_TABLET | Freq: Once | ORAL | Status: AC
  Administered 2022-08-30: 500 mg via ORAL
  Filled 2022-08-30: qty 1

## 2022-08-30 MED ORDER — LEVOFLOXACIN 500 MG PO TABS: 500.0000 mg | ORAL_TABLET | Freq: Every day | ORAL | 0 refills | Status: AC

## 2022-08-30 NOTE — ED Triage Notes (Signed)
Pt came in via POV d/t coughing up phlegm & blood has been noted, causing CP & back pain. This has been going on for the past 4 days, 9/10 pain. Reports SOB in general & that she is scheduled to have colonoscopy on April 1st.

## 2022-08-30 NOTE — ED Provider Notes (Signed)
[facility] Provider Note   CSN: [id number] Arrival date & time: 08/30/22  1037     History  Chief Complaint  Patient presents with   Chest Pain   Cough    [name] is a 42 y.o. female.  The history is provided by the patient. No language interpreter was used.  Chest Pain Pain location:  R lateral chest Pain quality: aching   Pain radiates to:  Does not radiate Pain severity:  Moderate Onset quality:  Gradual Timing:  Constant Progression:  Worsening Chronicity:  New Relieved by:  Nothing Worsened by:  Nothing Ineffective treatments:  None tried Associated symptoms: cough   Cough Cough characteristics:  Non-productive Sputum characteristics:  Nondescript Associated symptoms: chest pain        Home Medications Prior to Admission medications   Medication Sig Start Date End Date Taking? Authorizing Provider  acetaminophen (TYLENOL) 500 MG tablet Take 1,500 mg by mouth daily as needed for moderate pain.    [provider]  clindamycin (CLEOCIN) 150 MG capsule Take 1 capsule (150 mg total) by mouth 3 (three) times daily. Patient not taking: Reported on 07/10/2022 06/22/22   Charlesetta Shanks, MD  ferrous gluconate (FERGON) 324 MG tablet Take 1 tablet (324 mg total) by mouth daily with breakfast. 06/15/22   Regalado, Belkys A, MD  gabapentin (NEURONTIN) 300 MG capsule Take 1 capsule (300 mg total) by mouth 3 (three) times daily as needed for up to 30 doses. Patient taking differently: Take 300 mg by mouth daily as needed (for neuropathy). 02/24/21   Wyvonnia Dusky, MD  ibuprofen (ADVIL) 800 MG tablet Take 1 tablet (800 mg total) by mouth 3 (three) times daily. Patient taking differently: Take 800 mg by mouth in the morning and at bedtime. 05/19/22   Sherrill Raring, PA-C  metroNIDAZOLE (FLAGYL) 500 MG tablet Take 1 tablet (500 mg total) by mouth 2 (two) times daily. 07/30/22   Curatolo, Adam, DO  norethindrone (AYGESTIN)  5 MG tablet Take 2 tablets (10 mg total) by mouth daily. 07/10/22   Darliss Cheney, MD      Allergies    Amoxicillin and Hydrocodone    Review of Systems   Review of Systems  Respiratory:  Positive for cough.   Cardiovascular:  Positive for chest pain.  All other systems reviewed and are negative.   Physical Exam Updated Vital Signs BP (!) 147/102   Pulse 75   Temp 97.9 F (36.6 C) (Oral)   Resp (!) 24   Ht 5\' 3"  (1.6 m)   Wt 67.1 kg   SpO2 100%   BMI 26.22 kg/m  Physical Exam Vitals reviewed.  Constitutional:      Appearance: She is well-developed.  Cardiovascular:     Rate and Rhythm: Normal rate and regular rhythm.     Heart sounds: Normal heart sounds.  Pulmonary:     Effort: Pulmonary effort is normal.     Breath sounds: Normal breath sounds.  Abdominal:     Palpations: Abdomen is soft.  Musculoskeletal:        General: Normal range of motion.     Cervical back: Normal range of motion.  Skin:    General: Skin is warm.  Neurological:     General: No focal deficit present.     Mental Status: She is alert.  Psychiatric:        Mood and Affect: Mood normal.     ED Results / Procedures /  Treatments   Labs (all labs ordered are listed, but only abnormal results are displayed) Labs Reviewed  CBC WITH DIFFERENTIAL/PLATELET - Abnormal; Notable for the following components:      Result Value   RBC 5.25 (*)    MCV 76.6 (*)    MCH 23.0 (*)    RDW 19.9 (*)    Platelets 419 (*)    All other components within normal limits  COMPREHENSIVE METABOLIC PANEL - Abnormal; Notable for the following components:   Glucose, Bld 101 (*)    Calcium 8.7 (*)    Albumin 3.3 (*)    All other components within normal limits  SARS CORONAVIRUS 2 BY RT PCR  D-DIMER, QUANTITATIVE  TROPONIN I (HIGH SENSITIVITY)  TROPONIN I (HIGH SENSITIVITY)    EKG EKG Interpretation  Date/Time:  Wednesday August 30 2022 11:32:27 EDT Ventricular Rate:  79 PR Interval:  138 QRS  Duration: 85 QT Interval:  376 QTC Calculation: 431 R Axis:   69 Text Interpretation: Sinus rhythm since last tracing no significant change Confirmed by Malvin Johns [phone]) on 08/30/2022 11:35:52 AM  Radiology DG Chest 2 View  Result Date: 08/30/2022 CLINICAL DATA:  Provided history: Chest pain. Additional history provided: Shortness of breath, aching pain. Cough. EXAM: CHEST - 2 VIEW COMPARISON:  Prior chest radiographs 04/13/2022 and earlier. FINDINGS: Heart size within normal limits. Ill-defined opacities within the right lung base best appreciated on the PA radiograph. No appreciable airspace consolidation on the left. No evidence of pleural effusion or pneumothorax. No acute osseous abnormality identified. IMPRESSION: Ill-defined opacities within the right lung base, suspicious from pneumonia. Followup PA and lateral chest radiographs are recommended in 3-4 weeks following a trial of antibiotic therapy to ensure resolution. Electronically Signed   By: Kellie Simmering D.O.   On: 08/30/2022 11:55    Procedures Procedures    Medications Ordered in ED Medications - No data to display  ED Course/ Medical Decision Making/ A&P                             Medical Decision Making Patient complains of pain on the right side of her chest.  Patient reports she has had a cough for the past 4 days  Amount and/or Complexity of Data Reviewed Labs: ordered. Decision-making details documented in ED Course.    Details: Labs ordered reviewed and interpreted troponin is negative D-dimer is negative Radiology: ordered and independent interpretation performed. Decision-making details documented in ED Course.    Details: X-ray shows ill-defined opacities right lung base suspicious for pneumonia  Risk Prescription drug management. Risk Details: Patient is given Levaquin 500 mg here patient is given a prescription for Levaquin patient is advised to follow-up for repeat chest x-ray in 3 to 4 weeks.   Patient does not have a primary care physician she is advised to follow-up at [facility] urgent care.  Patient is advised to return to the emergency department if fever not relieved by Tylenol increased pain or shortness of breath           Final Clinical Impression(s) / ED Diagnoses Final diagnoses:  Pneumonia of right lung due to infectious organism, unspecified part of lung    Rx / DC Orders ED Discharge Orders          Ordered    levofloxacin (LEVAQUIN) 500 MG tablet  Daily        08/30/22 1312  An After Visit Summary was printed and given to the patient.    Fransico Meadow, PA-C 08/30/22 1440    Malvin Johns, MD 08/30/22 [phone]

## 2022-08-30 NOTE — Discharge Instructions (Addendum)
You need to have a repeat chest xray in 3-4 weeks.  Return if symptoms worsen or change.

## 2022-09-18 ENCOUNTER — Other Ambulatory Visit: Payer: Self-pay | Admitting: Gastroenterology

## 2022-09-18 ENCOUNTER — Encounter: Payer: Self-pay | Admitting: *Deleted

## 2022-09-18 DIAGNOSIS — D3500 Benign neoplasm of unspecified adrenal gland: Secondary | ICD-10-CM

## 2022-09-18 DIAGNOSIS — K529 Noninfective gastroenteritis and colitis, unspecified: Secondary | ICD-10-CM

## 2022-10-19 ENCOUNTER — Ambulatory Visit
Admission: RE | Admit: 2022-10-19 | Discharge: 2022-10-19 | Disposition: A | Discharge status: Home / Self Care | Payer: BC Managed Care – PPO | Source: Ambulatory Visit | Attending: Gastroenterology | Admitting: Gastroenterology

## 2022-10-19 DIAGNOSIS — D3500 Benign neoplasm of unspecified adrenal gland: Secondary | ICD-10-CM

## 2022-10-19 DIAGNOSIS — K529 Noninfective gastroenteritis and colitis, unspecified: Secondary | ICD-10-CM

## 2022-10-19 MED ORDER — IOPAMIDOL (ISOVUE-300) INJECTION 61%
100.0000 mL | Freq: Once | INTRAVENOUS | Status: AC | PRN
  Administered 2022-10-19: 100 mL via INTRAVENOUS

## 2023-05-07 ENCOUNTER — Emergency Department (HOSPITAL_COMMUNITY)
Admission: EM | Admit: 2023-05-07 | Discharge: 2023-05-07 | Disposition: A | Discharge status: Home / Self Care | Payer: Self-pay | Attending: Emergency Medicine | Admitting: Emergency Medicine

## 2023-05-07 ENCOUNTER — Other Ambulatory Visit: Payer: Self-pay

## 2023-05-07 ENCOUNTER — Encounter (HOSPITAL_COMMUNITY): Payer: Self-pay | Admitting: *Deleted

## 2023-05-07 ENCOUNTER — Emergency Department (HOSPITAL_COMMUNITY): Payer: Self-pay

## 2023-05-07 DIAGNOSIS — G43011 Migraine without aura, intractable, with status migrainosus: Secondary | ICD-10-CM | POA: Diagnosis not present

## 2023-05-07 DIAGNOSIS — R519 Headache, unspecified: Secondary | ICD-10-CM | POA: Diagnosis present

## 2023-05-07 LAB — BASIC METABOLIC PANEL
Anion gap: 7 (ref 5–15)
BUN: 7 mg/dL (ref 6–20)
CO2: 21 mmol/L — ABNORMAL LOW (ref 22–32)
Calcium: 9 mg/dL (ref 8.9–10.3)
Chloride: 109 mmol/L (ref 98–111)
Creatinine, Ser: 0.89 mg/dL (ref 0.44–1.00)
GFR, Estimated: >60 mL/min (ref >60)
Glucose, Bld: 99 mg/dL (ref 70–99)
Potassium: 3.7 mmol/L (ref 3.5–5.1)
Sodium: 137 mmol/L (ref 135–145)

## 2023-05-07 LAB — CBC
HCT: 35.1 % — ABNORMAL LOW (ref 36.0–46.0)
Hemoglobin: 10.7 g/dL — ABNORMAL LOW (ref 12.0–15.0)
MCH: 21.7 pg — ABNORMAL LOW (ref 26.0–34.0)
MCHC: 30.5 g/dL (ref 30.0–36.0)
MCV: 71.1 fL — ABNORMAL LOW (ref 80.0–100.0)
Platelets: 411 10*3/uL — ABNORMAL HIGH (ref 150–400)
RBC: 4.94 MIL/uL (ref 3.87–5.11)
RDW: 16.7 % — ABNORMAL HIGH (ref 11.5–15.5)
WBC: 5.8 10*3/uL (ref 4.0–10.5)
nRBC: 0 % (ref 0.0–0.2)

## 2023-05-07 LAB — HCG, SERUM, QUALITATIVE: Preg, Serum: NEGATIVE

## 2023-05-07 MED ORDER — DIPHENHYDRAMINE HCL 50 MG/ML IJ SOLN
25.0000 mg | Freq: Once | INTRAMUSCULAR | Status: AC
  Administered 2023-05-07: 25 mg via INTRAVENOUS
  Filled 2023-05-07: qty 1

## 2023-05-07 MED ORDER — PROCHLORPERAZINE EDISYLATE 10 MG/2ML IJ SOLN
10.0000 mg | Freq: Once | INTRAMUSCULAR | Status: AC
  Administered 2023-05-07: 10 mg via INTRAVENOUS
  Filled 2023-05-07: qty 2

## 2023-05-07 MED ORDER — LACTATED RINGERS IV BOLUS
1000.0000 mL | Freq: Once | INTRAVENOUS | Status: AC
  Administered 2023-05-07: 1000 mL via INTRAVENOUS

## 2023-05-07 MED ORDER — IOHEXOL 350 MG/ML SOLN
75.0000 mL | Freq: Once | INTRAVENOUS | Status: AC | PRN
  Administered 2023-05-07: 75 mL via INTRAVENOUS

## 2023-05-07 MED ORDER — ACETAMINOPHEN 500 MG PO TABS
1000.0000 mg | ORAL_TABLET | Freq: Once | ORAL | Status: AC
  Administered 2023-05-07: 1000 mg via ORAL
  Filled 2023-05-07: qty 2

## 2023-05-07 NOTE — ED Triage Notes (Signed)
C/o headache on the left side of her head x 3 days using OTC meds without relief. C/o occ. nausea

## 2023-05-07 NOTE — Discharge Instructions (Signed)
You can continue taking over-the-counter Tylenol as needed for headache.  Would also recommend buying some Excedrin headache medicine to keep at home in case this headache returns.  Make sure that you are drinking plenty of water.  If you get another 1 of these headaches, I would recommend laying down in a dark room where it is quiet to rest.  Please return to the ER if you have headache that is severe and its pain or different in the type of pain that you are having, you have confusion, you develop weakness or numbness in your arms or legs, you have difficulty walking, or you have trouble with your speech.

## 2023-05-07 NOTE — ED Provider Notes (Signed)
[facility] EMERGENCY DEPARTMENT AT [facility] Provider Note   CSN: [id number] Arrival date & time: 05/07/23  0865     History  Chief Complaint  Patient presents with   Headache    [name] is a 42 y.o. female.  42 year old female with a past medical history of colitis presents here for concerns of headache.  Headache began suddenly 3 days ago.  Pain is primarily located on the left side of her head.  She endorses some associated blurred vision in bilateral eyes, but worse on the left.  Also having associated nausea.  Did have emesis last night.  No fevers or recent illnesses.  Denies any new numbness or weakness in her upper or lower extremities.  Patient reports associated photosensitivity and phono sensitivity.  She denies any history of prior migraine headaches.  But does report having a history of tension type headaches, for which she typically takes Tylenol.  A single dose of Tylenol usually relieves her headache.  Patient states that she has not had a headache like this in the past.  She is not currently on oral contraceptives.  The history is provided by the patient and medical records.       Home Medications Prior to Admission medications   Medication Sig Start Date End Date Taking? Authorizing Provider  acetaminophen (TYLENOL) 500 MG tablet Take 1,000 mg by mouth as needed for moderate pain (pain score 4-6) or headache.   Yes [provider]  ferrous gluconate (FERGON) 324 MG tablet Take 1 tablet (324 mg total) by mouth daily with breakfast. 06/15/22  Yes Regalado, Belkys A, MD      Allergies    Amoxicillin and Hydrocodone    Review of Systems   Review of Systems  Physical Exam Updated Vital Signs BP (!) 137/93 (BP Location: Right Arm)   Pulse 60   Temp 99 F (37.2 C) (Oral)   Resp 16   Ht 5\' 3"  (1.6 m)   Wt 68.5 kg   SpO2 100%   BMI 26.75 kg/m  Physical Exam Vitals reviewed.  Constitutional:      General: She is not in acute  distress.    Appearance: She is normal weight. She is not ill-appearing, toxic-appearing or diaphoretic.  HENT:     Head: Normocephalic.     Mouth/Throat:     Mouth: Mucous membranes are moist.  Eyes:     General: No visual field deficit.    Extraocular Movements: Extraocular movements intact.     Conjunctiva/sclera: Conjunctivae normal.     Pupils: Pupils are equal, round, and reactive to light.  Cardiovascular:     Rate and Rhythm: Normal rate and regular rhythm.     Pulses:          Radial pulses are 2+ on the right side and 2+ on the left side.       Dorsalis pedis pulses are 2+ on the right side and 2+ on the left side.     Heart sounds: Normal heart sounds. No murmur heard.    No friction rub. No gallop.  Pulmonary:     Effort: Pulmonary effort is normal. No respiratory distress.     Breath sounds: Normal breath sounds. No wheezing, rhonchi or rales.  Abdominal:     General: There is no distension.     Palpations: Abdomen is soft.     Tenderness: There is no abdominal tenderness. There is no guarding or rebound.  Musculoskeletal:  Cervical back: Normal range of motion and neck supple. No rigidity.     Right lower leg: No edema.     Left lower leg: No edema.  Skin:    General: Skin is warm and dry.  Neurological:     Mental Status: She is alert.     GCS: GCS eye subscore is 4. GCS verbal subscore is 5. GCS motor subscore is 6.     Cranial Nerves: Cranial nerves 2-12 are intact. No cranial nerve deficit, dysarthria or facial asymmetry.     Sensory: Sensation is intact. No sensory deficit.     Motor: Motor function is intact. No weakness, tremor, abnormal muscle tone or pronator drift.     Coordination: Coordination is intact. Finger-Nose-Finger Test and Heel to [place] Test normal.     ED Results / Procedures / Treatments   Labs (all labs ordered are listed, but only abnormal results are displayed) Labs Reviewed  CBC - Abnormal; Notable for the following components:       Result Value   Hemoglobin 10.7 (*)    HCT 35.1 (*)    MCV 71.1 (*)    MCH 21.7 (*)    RDW 16.7 (*)    Platelets 411 (*)    All other components within normal limits  BASIC METABOLIC PANEL - Abnormal; Notable for the following components:   CO2 21 (*)    All other components within normal limits  HCG, SERUM, QUALITATIVE    EKG None  Radiology CT Angio Head W or Wo Contrast  Result Date: 05/07/2023 CLINICAL DATA:  Headache, sudden, severe EXAM: CT ANGIOGRAPHY HEAD TECHNIQUE: Multidetector CT imaging of the head was performed using the standard protocol during bolus administration of intravenous contrast. Multiplanar CT image reconstructions and MIPs were obtained to evaluate the vascular anatomy. RADIATION DOSE REDUCTION: This exam was performed according to the departmental dose-optimization program which includes automated exposure control, adjustment of the mA and/or kV according to patient size and/or use of iterative reconstruction technique. CONTRAST:  75mL OMNIPAQUE IOHEXOL 350 MG/ML SOLN COMPARISON:  07/23/2020 CT head FINDINGS: CT HEAD FINDINGS Brain: No evidence of acute infarct, hemorrhage, mass, mass effect, or midline shift. No hydrocephalus or extra-axial fluid collection. Normal pituitary. Craniocervical junction within normal limits. Vascular: No hyperdense vessel. Skull: Negative for fracture or focal lesion. Sinuses/Orbits: No acute finding. Other: The mastoid air cells are well aerated. CTA HEAD FINDINGS Anterior circulation: Both internal carotid arteries are patent to the termini, without significant stenosis. A1 segments patent. Normal anterior communicating artery. Anterior cerebral arteries are patent to their distal aspects without significant stenosis. No M1 stenosis or occlusion. MCA branches perfused to their distal aspects without significant stenosis. Posterior circulation: Vertebral arteries patent to the vertebrobasilar junction without significant stenosis.  Posterior inferior cerebellar arteries patent proximally. Basilar patent to its distal aspect without significant stenosis. Superior cerebellar arteries patent proximally. Patent, diminutive P1 segments. Near fetal origin of the bilateral PCAs from the posterior communicating arteries. PCAs perfused to their distal aspects without significant stenosis. Venous sinuses: As permitted by contrast timing, patent. Anatomic variants: Near fetal origin of the bilateral PCAs. No evidence of aneurysm or vascular malformation. Review of the MIP images confirms the above findings IMPRESSION: 1. No acute intracranial process. 2. No intracranial large vessel occlusion or significant stenosis. No evidence of aneurysm or vascular malformation. Electronically Signed   By: Wiliam Ke M.D.   On: 05/07/2023 12:15    Procedures Procedures    Medications Ordered in  ED Medications  prochlorperazine (COMPAZINE) injection 10 mg (10 mg Intravenous Given 05/07/23 0845)  diphenhydrAMINE (BENADRYL) injection 25 mg (25 mg Intravenous Given 05/07/23 0846)  lactated ringers bolus 1,000 mL (0 mLs Intravenous Stopped 05/07/23 1057)  acetaminophen (TYLENOL) tablet 1,000 mg (1,000 mg Oral Given 05/07/23 1057)  iohexol (OMNIPAQUE) 350 MG/ML injection 75 mL (75 mLs Intravenous Contrast Given 05/07/23 1206)    ED Course/ Medical Decision Making/ A&P Clinical Course as of 05/07/23 1326  Mon May 07, 2023  1003 I independently reviewed this patient's labs.  Laboratory workup notable for CBC with microcytic anemia that is slightly decreased from baseline (baseline 11-12). [JR]  1013 Patient reassessed.  She states that her headache is improved to a 5/10 out of 9.5/10 when she arrived.  Patient observed ambulating to the restroom.  Gait is even and steady.  No ataxia noted. [JR]  1223 CT Angio Head W or Wo Contrast I independently reviewed this patient's imaging.  No evidence of intracranial hemorrhage.  No large vessel occlusion or  aneurysm noted. [JR]  1223 Patient was reassessed.  She continues to endorse improvement in her symptoms.  She has remained hemodynamically stable throughout evaluation here.  Patient has been ambulatory here. [JR]    Clinical Course User Index [JR] Rolla Flatten, MD                                 Medical Decision Making Amount and/or Complexity of Data Reviewed Labs: ordered. Radiology: ordered and independent interpretation performed. Decision-making details documented in ED Course.  Risk OTC drugs. Prescription drug management.   41 year old female presents here for concerns of headache.  Vitals reassuring on presentation.  Notably, no fever.  Slightly hypertensive.  On exam, patient is tearful.  She is uncomfortable appearing.  Neurologic exam without deficits.  Initial differential diagnosis includes subarachnoid hemorrhage, cerebral aneurysm, dural venous thrombosis, migraine headache, tension headache, preeclampsia, meningitis.  Will obtain screening labs, including BMP, CBC, qualitative hCG.  Will also obtain imaging with CTA head to evaluate for aneurysm, subarachnoid hemorrhage.  I considered dural venous thrombosis.  However, patient has no history of coagulopathy.  Not on oral contraceptives.,  Feel that alternative diagnosis is more likely.  Therefore, do not feel that MRV is indicated at this time.  Consider meningitis.  However, she has not had any recent illnesses and is afebrile here.  She has normal range of motion of the neck without notable stiffness.  Feel that meningitis is less likely.  Will treat patient's symptoms with migraine cocktail, including fluid bolus, Compazine, Benadryl.  Labs reviewed and notable only for microcytic anemia that is slightly decreased from patient's baseline.  However, patient does have a known history of chronic anemia.  hCG negative.  Workup not consistent with preeclampsia.  Neuroimaging obtained.  No evidence of intracranial head injury,  cerebral aneurysm, or large vessel occlusion.  Exam is consistent with migraine headache.  On multiple reassessments of the patient, she had continually improving symptoms after being treated with symptomatic treatment here.  She is ambulatory here.  Vitals have remained stable.  Workup here overall reassuring.  Patient has no indications for admission based on workup.  Feel that she is appropriate for outpatient management.  Discharged in stable condition.  Patient's presentation is most consistent with acute presentation with potential threat to life or bodily function.         Final Clinical Impression(s) / ED  Diagnoses Final diagnoses:  Intractable migraine without aura and with status migrainosus    Rx / DC Orders ED Discharge Orders     None         Rolla Flatten, MD 05/07/23 1326    Wynetta Fines, MD 05/07/23 [phone]

## 2023-06-03 ENCOUNTER — Emergency Department (HOSPITAL_COMMUNITY): Payer: Self-pay

## 2023-06-03 ENCOUNTER — Other Ambulatory Visit: Payer: Self-pay

## 2023-06-03 ENCOUNTER — Encounter (HOSPITAL_COMMUNITY): Payer: Self-pay | Admitting: Emergency Medicine

## 2023-06-03 ENCOUNTER — Emergency Department (HOSPITAL_COMMUNITY)
Admission: EM | Admit: 2023-06-03 | Discharge: 2023-06-03 | Disposition: A | Discharge status: Home / Self Care | Payer: Self-pay | Attending: Emergency Medicine | Admitting: Emergency Medicine

## 2023-06-03 DIAGNOSIS — J329 Chronic sinusitis, unspecified: Secondary | ICD-10-CM | POA: Insufficient documentation

## 2023-06-03 DIAGNOSIS — Z1152 Encounter for screening for COVID-19: Secondary | ICD-10-CM | POA: Insufficient documentation

## 2023-06-03 LAB — GROUP A STREP BY PCR: Group A Strep by PCR: NOT DETECTED

## 2023-06-03 LAB — RESP PANEL BY RT-PCR (RSV, FLU A&B, COVID)  RVPGX2
Influenza A by PCR: NEGATIVE
Influenza B by PCR: NEGATIVE
Resp Syncytial Virus by PCR: NEGATIVE
SARS Coronavirus 2 by RT PCR: NEGATIVE

## 2023-06-03 MED ORDER — AZITHROMYCIN 250 MG PO TABS: 250.0000 mg | ORAL_TABLET | Freq: Every day | ORAL | 0 refills | Status: DC

## 2023-06-03 NOTE — ED Notes (Signed)
Patient verbalizes understanding of discharge instructions. Opportunity for questioning and answers were provided. Armband removed by staff, pt discharged from ED. Pt ambulatory to ED waiting room with steady gait.  

## 2023-06-03 NOTE — ED Provider Triage Note (Cosign Needed)
Emergency Medicine Provider Triage Evaluation Note  [name] , a 42 y.o. female  was evaluated in triage.  Pt complains of slight productive cough, sore throat, and headache w/chillsx2 days.  Review of Systems  Positive: Sore throat Negative: Abdominal pain  Physical Exam  BP (!) 146/84 (BP Location: Right Arm)   Pulse 84   Temp 98.4 F (36.9 C) (Oral)   Resp 18   SpO2 100%  Gen:   Awake, no distress   Resp:  Normal effort  MSK:   Moves extremities without difficulty  Medical Decision Making  Medically screening exam initiated at 5:22 PM.  Appropriate orders placed.  [name] was informed that the remainder of the evaluation will be completed by another provider, this initial triage assessment does not replace that evaluation, and the importance of remaining in the ED until their evaluation is complete.    Pete Pelt, [place] 06/03/23 1723

## 2023-06-03 NOTE — ED Triage Notes (Signed)
Headache, pressure, aches, sore throat nasal congestion, sweating, No relief with OTC meds

## 2023-06-03 NOTE — ED Provider Notes (Signed)
[facility]PT [facility] Emergency Department Provider Note MRN:  [id number]  Arrival date & time: 06/03/23     Chief Complaint   Generalized Body Aches   History of Present Illness   [name] is a 42 y.o. year-old female presents to the ED with chief complaint of sinus pressure and congestion that has rapidly worsened over the past 2 days.  She reports subjective fevers and chills.  She denies any other associated symptoms.  She has tried OTC meds without much success.  History provided by patient.   Review of Systems  Pertinent positive and negative review of systems noted in HPI.    Physical Exam   Vitals:   06/03/23 1719 06/03/23 2109  BP: (!) 146/84 (!) 143/95  Pulse: 84 80  Resp: 18 16  Temp: 98.4 F (36.9 C) 98.3 F (36.8 C)  SpO2: 100% 99%    CONSTITUTIONAL:  non toxic-appearing, NAD NEURO:  Alert and oriented x 3, CN 3-12 grossly intact EYES:  eyes equal and reactive ENT/NECK:  Supple, no stridor, frontal sinus pressure CARDIO:  normal rate, regular rhythm, appears well-perfused  PULM:  No respiratory distress, CTAB GI/GU:  non-distended,  MSK/SPINE:  No gross deformities, no edema, moves all extremities  SKIN:  no rash, atraumatic   *Additional and/or pertinent findings included in MDM below  Diagnostic and Interventional Summary    EKG Interpretation Date/Time:    Ventricular Rate:    PR Interval:    QRS Duration:    QT Interval:    QTC Calculation:   R Axis:      Text Interpretation:         Labs Reviewed  RESP PANEL BY RT-PCR (RSV, FLU A&B, COVID)  [id number]  GROUP A STREP BY PCR    DG Chest 2 View  Final Result      Medications - No data to display   Procedures  /  Critical Care Procedures  ED Course and Medical Decision Making  I have reviewed the triage vital signs, the nursing notes, and pertinent available records from the EMR.  Social Determinants Affecting Complexity of Care: Patient has no clinically  significant social determinants affecting this chief complaint..   ED Course:    Medical Decision Making Risk Prescription drug management.    Patient here with sinus congestion.  Rapidly worsening over the past couple of days.  States that she has had fevers and chills.  Given the rapidly worsening symptoms, she might benefit from an antibiotic.     Consultants: No consultations were needed in caring for this patient.   Treatment and Plan: Emergency department workup does not suggest an emergent condition requiring admission or immediate intervention beyond  what has been performed at this time. The patient is safe for discharge and has  been instructed to return immediately for worsening symptoms, change in  symptoms or any other concerns    Final Clinical Impressions(s) / ED Diagnoses     ICD-10-CM   1. Sinusitis, unspecified chronicity, unspecified location  J32.9       ED Discharge Orders          Ordered    azithromycin (ZITHROMAX) 250 MG tablet  Daily        06/03/23 2307              Discharge Instructions Discussed with and Provided to Patient:   Discharge Instructions   None      [name], PA-C 06/04/23 0540    Madilyn Hook,  Lanora Manis, MD 06/04/23 [phone]

## 2023-11-13 ENCOUNTER — Ambulatory Visit: Payer: Self-pay | Admitting: Nurse Practitioner

## 2023-11-13 ENCOUNTER — Encounter: Payer: Self-pay | Admitting: Nurse Practitioner

## 2023-11-13 VITALS — BP 136/82 | HR 87 | Temp 97.2°F

## 2023-11-13 DIAGNOSIS — D509 Iron deficiency anemia, unspecified: Secondary | ICD-10-CM

## 2023-11-13 DIAGNOSIS — Z789 Other specified health status: Secondary | ICD-10-CM

## 2023-11-13 DIAGNOSIS — K529 Noninfective gastroenteritis and colitis, unspecified: Secondary | ICD-10-CM

## 2023-11-13 DIAGNOSIS — R030 Elevated blood-pressure reading, without diagnosis of hypertension: Secondary | ICD-10-CM

## 2023-11-13 DIAGNOSIS — Z1231 Encounter for screening mammogram for malignant neoplasm of breast: Secondary | ICD-10-CM

## 2023-11-13 MED ORDER — FERROUS GLUCONATE 324 (38 FE) MG PO TABS: 324.0000 mg | ORAL_TABLET | Freq: Every day | ORAL | 1 refills | Status: DC

## 2023-11-13 NOTE — Progress Notes (Signed)
 Occupational Health- [facility]  Subjective:  Patient ID: [name], female    DOB: 1980/12/16  Age: 43 y.o. MRN: [id number]  CC: wellness exam  HPI [name] presents for wellness exam visit for insurance benefit.  Recent hire at [employer], has been uninsured but will obtain health insurance December 04, 2023.  Patient has a PCP: No PCP PMH significant for: hx of colitis ,seen by GI for this. Has occasional flare-ups. Hx of iron  def anemia. Reports almost out of iron  supplement. She takes them occasionally.  Last labs per PCP were completed: 2024  Health Maintenance:  Colonoscopy: no family history.  Mammogram: has not had one since turning 40.  Pap: several years- last documented 2017.     Smoker: every day smoker- 4-7 cigarettes x 19 years. Would like to quit.   Immunizations:  Tdap- 2022 Flu- yearly.   Lifestyle: Diet- no particular diet Exercise- does not exercise but is very active at work. She works at a [employer].      Past Medical History:  Diagnosis Date   Anemia     Past Surgical History:  Procedure Laterality Date   CESAREAN SECTION     OVARIAN CYST REMOVAL      Outpatient Medications Prior to Visit  Medication Sig Dispense Refill   acetaminophen  (TYLENOL ) 500 MG tablet Take 1,000 mg by mouth as needed for moderate pain (pain score 4-6) or headache.     azithromycin  (ZITHROMAX ) 250 MG tablet Take 1 tablet (250 mg total) by mouth daily. Take first 2 tablets together, then 1 every day until finished. 6 tablet 0   ferrous gluconate  (FERGON) 324 MG tablet Take 1 tablet (324 mg total) by mouth daily with breakfast. (Patient not taking: Reported on 11/13/2023) 30 tablet 3   No facility-administered medications prior to visit.    ROS Review of Systems  HENT:  Negative for hearing loss.   Eyes:  Positive for visual disturbance (needs new readings glasses.).  Respiratory:  Positive for cough (sometimes) and shortness of breath (reports due to smoking).    Cardiovascular:  Negative for chest pain.  Gastrointestinal:  Positive for abdominal pain (hx of colitis.). Negative for constipation, diarrhea, nausea and vomiting.  Musculoskeletal:  Positive for back pain (chronic).  Neurological:  Negative for dizziness and headaches.    Objective:  BP 136/82 (BP Location: Left Arm, Patient Position: Sitting)   Pulse 87   Temp (!) 97.2 F (36.2 C)   LMP 10/18/2023 (Approximate)   SpO2 98%   Physical Exam Constitutional:      General: She is not in acute distress. HENT:     Head: Normocephalic.     Right Ear: Tympanic membrane, ear canal and external ear normal.     Left Ear: Tympanic membrane, ear canal and external ear normal.     Nose: Nose normal.     Mouth/Throat:     Pharynx: Oropharynx is clear. No posterior oropharyngeal erythema.  Eyes:     Pupils: Pupils are equal, round, and reactive to light.  Cardiovascular:     Rate and Rhythm: Normal rate and regular rhythm.     Heart sounds: Normal heart sounds.  Pulmonary:     Effort: Pulmonary effort is normal.     Breath sounds: Normal breath sounds.  Abdominal:     General: Abdomen is flat. Bowel sounds are normal.     Palpations: Abdomen is soft.  Musculoskeletal:        General: Normal range of motion.  Right lower leg: No edema.     Left lower leg: No edema.  Skin:    General: Skin is warm.  Neurological:     General: No focal deficit present.     Mental Status: She is alert and oriented to person, place, and time.  Psychiatric:        Mood and Affect: Mood normal.        Behavior: Behavior normal.      Assessment & Plan:    [name] was seen today for wellness exam.  Diagnoses and all orders for this visit:  Participant in health and wellness plan Adult wellness physical was conducted today. Importance of diet and exercise were discussed in detail.  We reviewed immunizations and gave recommendations regarding current immunization needed for age.  Preventative  health exams needed: Mammogram, pap smear, and labs.  She would like to defer labs to new PCP appointment July 15.  Patient was advised yearly wellness exam.  Encounter for screening mammogram for breast cancer -     MM 3D SCREENING MAMMOGRAM BILATERAL BREAST; Future Patient would like to participate at [facility] mammogram bus event July 23rd. Order placed.   Elevated BP without diagnosis of hypertension Discussed BP goal less than 130/80. Recommended DASH diet and lifestyle changes.  Smoking cessation discussed: Quit line and [facility] smoking cessation program discussed.   Ileocolitis No symptoms today but did recommended following up with GI for further treatment and management.   Iron  deficiency anemia, unspecified iron  deficiency anemia type Will refill x2 months. Does need labs updated but plans to defer to new pcp appointment next month. Follow-up with new PCP for continued management.  -     ferrous gluconate  (FERGON) 324 MG tablet; Take 1 tablet (324 mg total) by mouth daily with breakfast.    Orders Placed This Encounter  Procedures   MM 3D SCREENING MAMMOGRAM BILATERAL BREAST    Meds ordered this encounter  Medications   ferrous gluconate  (FERGON) 324 MG tablet    Sig: Take 1 tablet (324 mg total) by mouth daily with breakfast.    Dispense:  30 tablet    Refill:  1    Supervising Provider:   BACIGALUPO, ANGELA M [[id number]]    Follow-up: as needed

## 2023-12-11 ENCOUNTER — Encounter: Payer: Self-pay | Admitting: Nurse Practitioner

## 2023-12-11 ENCOUNTER — Ambulatory Visit: Payer: Self-pay | Admitting: Nurse Practitioner

## 2023-12-11 VITALS — BP 152/82 | HR 87 | Temp 97.5°F

## 2023-12-11 DIAGNOSIS — M67441 Ganglion, right hand: Secondary | ICD-10-CM

## 2023-12-11 DIAGNOSIS — R030 Elevated blood-pressure reading, without diagnosis of hypertension: Secondary | ICD-10-CM

## 2023-12-11 NOTE — Progress Notes (Signed)
 Acute Office Visit  Subjective:     Patient ID: [name], female    DOB: 21-Nov-1980, 43 y.o.   MRN: [id number]  Chief Complaint  Patient presents with   Thumb concern    HPI Patient is in today for right thumb concern. Noticed bump on Friday night.  Reports pain Saturday and Sunday. Placed Tegaderm on this.  Very mild pain today. Described as sharp, pain comes and goes.  No open wound noted. No discharge to site. Denies trauma or injury.  Bump has decreased in size.  No concern with movement of thumb, denies swelling of thumb Has not taken anything for pain.   Of note, she presents upset and tearful after finding out a friend's grandmother passed away over the weekend.   Review of Systems  Constitutional:  Negative for chills and fever.        Objective:    BP (!) 152/82   Pulse 87   Temp (!) 97.5 F (36.4 C)   LMP 10/18/2023 (Approximate)   SpO2 99%    Physical Exam Constitutional:      General: She is not in acute distress. Cardiovascular:     Rate and Rhythm: Regular rhythm.  Pulmonary:     Effort: Pulmonary effort is normal.  Musculoskeletal:     Right hand: Normal.     Left hand: Normal.     Comments: Right thumb with 1/2 pea size cyst noted. No ertheyma or discharge present. Cyst is hard no abscess palpated. Tender to palpation.   Neurological:     General: No focal deficit present.     Mental Status: She is alert and oriented to person, place, and time.     No results found for any visits on 12/11/23.      Assessment & Plan:   Problem List Items Addressed This Visit   None Visit Diagnoses       Elevated BP without diagnosis of hypertension    -  Primary     Ganglion cyst of joint of finger of right hand      Recommended supportive measures for cyst including otc pain medication, will likely resolve on its own, if not can follow-up with PCP for further management/ referrals.   BP is elevated today. Patient is visibly upset but I did  recommend obtaining BP log over the next 2 weeks. She can take this to her PCP appointment scheduled later this month.  Encouraged limiting sodium intake. Continue to encourage smoking cessation.      No orders of the defined types were placed in this encounter.  As needed.   Mariella Blackwelder Flores Brok Stocking, NP

## 2023-12-24 ENCOUNTER — Encounter: Payer: Self-pay | Admitting: Family Medicine

## 2023-12-24 ENCOUNTER — Ambulatory Visit (INDEPENDENT_AMBULATORY_CARE_PROVIDER_SITE_OTHER): Payer: PRIVATE HEALTH INSURANCE | Admitting: Family Medicine

## 2023-12-24 ENCOUNTER — Ambulatory Visit: Payer: Self-pay | Admitting: Family Medicine

## 2023-12-24 VITALS — BP 130/92 | HR 89 | Temp 98.2°F | Ht 63.0 in | Wt 148.0 lb

## 2023-12-24 DIAGNOSIS — R0989 Other specified symptoms and signs involving the circulatory and respiratory systems: Secondary | ICD-10-CM

## 2023-12-24 DIAGNOSIS — H6122 Impacted cerumen, left ear: Secondary | ICD-10-CM | POA: Diagnosis not present

## 2023-12-24 DIAGNOSIS — Z7689 Persons encountering health services in other specified circumstances: Secondary | ICD-10-CM

## 2023-12-24 DIAGNOSIS — R718 Other abnormality of red blood cells: Secondary | ICD-10-CM

## 2023-12-24 DIAGNOSIS — D509 Iron deficiency anemia, unspecified: Secondary | ICD-10-CM

## 2023-12-24 DIAGNOSIS — M2550 Pain in unspecified joint: Secondary | ICD-10-CM | POA: Diagnosis not present

## 2023-12-24 DIAGNOSIS — E663 Overweight: Secondary | ICD-10-CM

## 2023-12-24 LAB — COMPREHENSIVE METABOLIC PANEL WITH GFR
ALT: 9 U/L (ref 0–35)
AST: 14 U/L (ref 0–37)
Albumin: 3.9 g/dL (ref 3.5–5.2)
Alkaline Phosphatase: 55 U/L (ref 39–117)
BUN: 13 mg/dL (ref 6–23)
CO2: 27 meq/L (ref 19–32)
Calcium: 9.4 mg/dL (ref 8.4–10.5)
Chloride: 107 meq/L (ref 96–112)
Creatinine, Ser: 1.21 mg/dL — ABNORMAL HIGH (ref 0.40–1.20)
GFR: 54.96 mL/min — ABNORMAL LOW (ref >60.00)
Glucose, Bld: 84 mg/dL (ref 70–99)
Potassium: 4.4 meq/L (ref 3.5–5.1)
Sodium: 139 meq/L (ref 135–145)
Total Bilirubin: 0.3 mg/dL (ref 0.2–1.2)
Total Protein: 7.1 g/dL (ref 6.0–8.3)

## 2023-12-24 LAB — CBC WITH DIFFERENTIAL/PLATELET
Basophils Absolute: 0.1 K/uL (ref 0.0–0.1)
Basophils Relative: 1.2 % (ref 0.0–3.0)
Eosinophils Absolute: 0.1 K/uL (ref 0.0–0.7)
Eosinophils Relative: 2.9 % (ref 0.0–5.0)
HCT: 32.8 % — ABNORMAL LOW (ref 36.0–46.0)
Hemoglobin: 10 g/dL — ABNORMAL LOW (ref 12.0–15.0)
Lymphocytes Relative: 19.6 % (ref 12.0–46.0)
Lymphs Abs: 0.9 K/uL (ref 0.7–4.0)
MCHC: 30.5 g/dL (ref 30.0–36.0)
MCV: 65.9 fl — ABNORMAL LOW (ref 78.0–100.0)
Monocytes Absolute: 0.4 K/uL (ref 0.1–1.0)
Monocytes Relative: 9.7 % (ref 3.0–12.0)
Neutro Abs: 2.9 K/uL (ref 1.4–7.7)
Neutrophils Relative %: 66.6 % (ref 43.0–77.0)
Platelets: 491 K/uL — ABNORMAL HIGH (ref 150.0–400.0)
RBC: 4.98 Mil/uL (ref 3.87–5.11)
RDW: 23.1 % — ABNORMAL HIGH (ref 11.5–15.5)
WBC: 4.4 K/uL (ref 4.0–10.5)

## 2023-12-24 LAB — POC COVID19 BINAXNOW: SARS Coronavirus 2 Ag: NEGATIVE

## 2023-12-24 LAB — TSH: TSH: 0.45 u[IU]/mL (ref 0.35–5.50)

## 2023-12-24 LAB — HEMOGLOBIN A1C: Hgb A1c MFr Bld: 5.4 % (ref 4.6–6.5)

## 2023-12-24 LAB — SEDIMENTATION RATE: Sed Rate: 23 mm/h — ABNORMAL HIGH (ref 0–20)

## 2023-12-24 NOTE — Patient Instructions (Addendum)
 It was a pleasure to see you today!  -Labs ordered (CBC, CMP, TSH, A1C, iron  panel) to assess hemoglobin, red blood cells, white blood cells, kidney and liver function, thyroid  function, glucose levels, and iron  levels.  - If iron  and hemoglobin are low/ abnormal a hematology referral will be ordered in the future.  - Labs ordered ( sedimentation rate, ANA, rheumatoid factor, and cyclic citrul peptide antibody) to assess for autoimmune diseases, arthritis, and inflammation.  - If labs above come back abnormal a rheumatology referral will be ordered in the future.  - Ear lavage performed on left ear due to impaction.  -Recommend OTC Debrox- apply to left ear the dya before next visit to help with impaction. This will help soften the ear wax for removal.  - Negative covid test preformed in office today due to cough/ cold symptoms.  - Recommend over the ocunter zyrtec 10mg  or flonase to help with cough/ cold symptoms. - Recommend monitoring blood pressure every day for 2 weeks. Blood pressure log will be given.  Return for a follow-up in 2 weeks.

## 2023-12-24 NOTE — Progress Notes (Signed)
 New Patient Office Visit  Subjective    Patient ID: [name], female    DOB: 09/15/1980  Age: 43 y.o. MRN: [id number]  CC:  Chief Complaint  Patient presents with   Establish Care    HPI [name] presents to establish care  Previous PCP- none Specialist- [facility]    Cough- Patient presents tody with complaints of intermittent productive cough with yellowish/brownish phlegm, and congestion since December 2024. This past exacerbation of cough/cold symptoms have lasted since June 2025. She is taking Mucinex, theraflu and tylenol  for symptoms. Medication sometimes improves her symptoms. Does not know if she has seasonal allergies. She has not paid attention to when it comes or goes away. Never had allergy testing. She has never tried zyrtec or flonase. Denies fever, body aches sore throat and sinus pain.   Right elbow sharp pain- Patient has complaints of multiple joint aches and cramps. cramps and spasms come and go which started last month. She states that she occasionally has swelling. Swelling occurs on her knees, ankles and hands. No joint swelling at today's visit  Increased Blood pressure- Patient has complaints of increased blood pressure. She previously visited urgent care and had an elevated reading of 152/82. She does not monitor blood pressure at home. She occasionally has headaches from her hair being up. Denies chest pain, dizziness, palpitations and lower extremity edema.   Pap smear- 10/21/2015  Covid- 19 does not participate in boosters- had first series  Outpatient Encounter Medications as of 12/24/2023  Medication Sig   acetaminophen  (TYLENOL ) 500 MG tablet Take 1,000 mg by mouth as needed for moderate pain (pain score 4-6) or headache.   ferrous gluconate  (FERGON) 324 MG tablet Take 324 mg by mouth daily with breakfast.   [DISCONTINUED] ferrous gluconate  (FERGON) 324 MG tablet Take 1 tablet (324 mg total) by mouth daily with breakfast. (Patient  not taking: Reported on 12/11/2023)   No facility-administered encounter medications on file as of 12/24/2023.    Past Medical History:  Diagnosis Date   Anemia    Colitis    High blood pressure    Spasms of the hands or feet     Past Surgical History:  Procedure Laterality Date   CESAREAN SECTION     OVARIAN CYST REMOVAL      Family History  Problem Relation Age of Onset   Hypertension Mother    Diabetes Mother    Hypertension Father    Breast cancer Maternal Grandmother     Social History   Socioeconomic History   Marital status: Married    Spouse name: Not on file   Number of children: Not on file   Years of education: Not on file   Highest education level: Not on file  Occupational History   Not on file  Tobacco Use   Smoking status: Every Day    Current packs/day: 0.50    Types: Cigarettes, Cigars   Smokeless tobacco: Never   Tobacco comments:    5-7 cigarettes a day  Vaping Use   Vaping status: Never Used  Substance and Sexual Activity   Alcohol use: Yes    Comment: rarely   Drug use: Yes    Types: Marijuana   Sexual activity: Yes    Birth control/protection: None  Other Topics Concern   Not on file  Social History Narrative   Not on file   Social Drivers of Health   Financial Resource Strain: Low Risk  (12/24/2023)   Overall  Financial Resource Strain (CARDIA)    Difficulty of Paying Living Expenses: Not hard at all  Food Insecurity: No Food Insecurity (06/14/2022)   Hunger Vital Sign    Worried About Running Out of Food in the Last Year: Never true    Ran Out of Food in the Last Year: Never true  Transportation Needs: No Transportation Needs (06/14/2022)   PRAPARE - Administrator, Civil Service (Medical): No    Lack of Transportation (Non-Medical): No  Physical Activity: Inactive (12/24/2023)   Exercise Vital Sign    Days of Exercise per Week: 0 days    Minutes of Exercise per Session: 0 min  Stress: No Stress Concern Present  (12/24/2023)   [employer] of Occupational Health - Occupational Stress Questionnaire    Feeling of Stress: Only a little  Social Connections: Moderately Integrated (12/24/2023)   Social Connection and Isolation Panel    Frequency of Communication with Friends and Family: More than three times a week    Frequency of Social Gatherings with Friends and Family: Twice a week    Attends Religious Services: 1 to 4 times per year    Active Member of [employer] or Organizations: No    Attends Banker Meetings: Never    Marital Status: Married  Catering manager Violence: Not At Risk (06/14/2022)   Humiliation, Afraid, Rape, and Kick questionnaire    Fear of Current or Ex-Partner: No    Emotionally Abused: No    Physically Abused: No    Sexually Abused: No    Review of Systems  HENT:  Positive for congestion. Negative for sore throat.   Respiratory:  Positive for cough.   Cardiovascular:  Negative for chest pain and palpitations.  Gastrointestinal:  Positive for constipation, heartburn and nausea. Negative for diarrhea and vomiting.  Musculoskeletal:  Positive for joint pain.  Neurological:  Positive for headaches (occ). Negative for dizziness.        Objective    BP (!) 130/92   Pulse 89   Temp 98.2 F (36.8 C) (Oral)   Ht 5' 3 (1.6 m)   Wt 148 lb (67.1 kg)   LMP 11/30/2023 (Approximate)   SpO2 98%   BMI 26.22 kg/m   Physical Exam Constitutional:      Appearance: Normal appearance.  HENT:     Head: Normocephalic and atraumatic.     Right Ear: Tympanic membrane, ear canal and external ear normal. There is no impacted cerumen.     Left Ear: There is impacted cerumen.     Nose: Nose normal.     Mouth/Throat:     Mouth: Mucous membranes are moist.     Pharynx: Oropharynx is clear.  Eyes:     Extraocular Movements: Extraocular movements intact.     Conjunctiva/sclera: Conjunctivae normal.  Cardiovascular:     Rate and Rhythm: Normal rate and regular rhythm.      Pulses: Normal pulses.     Heart sounds: Normal heart sounds.  Pulmonary:     Effort: Pulmonary effort is normal. No respiratory distress.     Breath sounds: Normal breath sounds.  Musculoskeletal:        General: Normal range of motion.     Cervical back: Normal range of motion.  Skin:    General: Skin is warm and dry.     Capillary Refill: Capillary refill takes less than 2 seconds.  Neurological:     Mental Status: She is oriented to person, place, and time.  Psychiatric:        Mood and Affect: Mood normal.        Behavior: Behavior normal.        Thought Content: Thought content normal.        Judgment: Judgment normal.   PRE-PROCEDURE EXAM: Left TM cannot be visualized due to total occlusion/impaction of the ear canal. PROCEDURE INDICATION: Remove wax to visualize ear drum & relieve discomfort CONSENT:  Verbal  PROCEDURE NOTE: Left ear: The CMA, Kyleigh Sigmons, irrigated left ear with warm water and ear drops to remove the wax. 100% of the wax was removed.  POST- PROCEDURE EXAM: TMs unsuccessful.  The patient tolerated the procedure well.   Assessment & Plan:   Problem List Items Addressed This Visit       Other   Iron  deficiency anemia - Primary   Relevant Medications   ferrous gluconate  (FERGON) 324 MG tablet   Other Relevant Orders   CBC with Differential/Platelets   Iron , TIBC and Ferritin Panel   Other Visit Diagnoses       Chest congestion       Relevant Orders   POC COVID-19 (Completed)     Overweight (BMI 25.0-29.9)       Relevant Orders   CBC with Differential/Platelets   CMP   Hemoglobin A1c   TSH     Multiple joint pain       Relevant Orders   CBC with Differential/Platelets   CMP   ANA   Rheumatoid Factor   Sedimentation Rate   Cyclic citrul peptide antibody, IgG     Impacted cerumen, left ear       Relevant Orders   Ear Lavage     Encounter to establish care          Anemia  - Labs ordered (CBC, iron  panel to assess hemoglobin,  RBC's and iron  stores. -If iron  and hemoglobin are low/ abnormal a hematology referral will be ordered in the future.   Establish care and BMI of 26 -Labs ordered (CBC, CMP, TSH, A1C, iron  panel) to assess hemoglobin, red blood cells, white blood cells, kidney and liver function, thyroid  function, glucose levels, and iron  levels.   Multiple Joint pain  - Labs ordered ( sedimentation rate, ANA, rheumatoid factor, and cyclic citrul peptide antibody) to assess for autoimmune diseases, arthritis, and inflammation.  - If labs above come back abnormal a rheumatology referral will be ordered in the future.   Left ear impaction - Ear lavage performed on left ear due to impaction.  -Recommend OTC Debrox- apply to left ear the dya before next visit to help with impaction. This will help soften the ear wax for removal.   Congestion - Negative covid test preformed in office today due to cough/ cold symptoms.  - Recommend over the ocunter zyrtec 10mg  or flonase to help with cough/ cold symptoms.  High blood pressure - Recommend monitoring blood pressure every day for 2 weeks. Blood pressure log will be given.  Return in about 2 weeks (around 01/07/2024).   JoAnna Williamson, NP

## 2023-12-25 ENCOUNTER — Ambulatory Visit (INDEPENDENT_AMBULATORY_CARE_PROVIDER_SITE_OTHER): Payer: Self-pay

## 2023-12-25 ENCOUNTER — Ambulatory Visit: Payer: Self-pay | Admitting: Family Medicine

## 2023-12-25 DIAGNOSIS — R718 Other abnormality of red blood cells: Secondary | ICD-10-CM | POA: Diagnosis not present

## 2023-12-25 LAB — VITAMIN B12: Vitamin B-12: 528 pg/mL (ref 211–911)

## 2023-12-25 NOTE — Addendum Note (Signed)
 Addended by: ELNER NANNY B on: 12/25/2023 01:57 PM   Modules accepted: Orders

## 2023-12-26 ENCOUNTER — Ambulatory Visit: Payer: Self-pay

## 2023-12-29 LAB — IRON,TIBC AND FERRITIN PANEL
%SAT: 4 % — ABNORMAL LOW (ref 16–45)
Ferritin: 7 ng/mL — ABNORMAL LOW (ref 16–232)
Iron: 14 ug/dL — ABNORMAL LOW (ref 40–190)
TIBC: 399 ug/dL (ref 250–450)

## 2023-12-29 LAB — ANTI-NUCLEAR AB-TITER (ANA TITER): ANA Titer 1: 1:80 {titer} — ABNORMAL HIGH

## 2023-12-29 LAB — CYCLIC CITRUL PEPTIDE ANTIBODY, IGG: Cyclic Citrullin Peptide Ab: <16 U

## 2023-12-29 LAB — ANA: Anti Nuclear Antibody (ANA): POSITIVE — AB

## 2023-12-29 LAB — RHEUMATOID FACTOR: Rheumatoid fact SerPl-aCnc: <10 [IU]/mL (ref <14)

## 2024-01-07 ENCOUNTER — Encounter: Payer: Self-pay | Admitting: Family Medicine

## 2024-01-07 ENCOUNTER — Ambulatory Visit: Payer: PRIVATE HEALTH INSURANCE | Admitting: Family Medicine

## 2024-01-07 ENCOUNTER — Ambulatory Visit (INDEPENDENT_AMBULATORY_CARE_PROVIDER_SITE_OTHER): Payer: PRIVATE HEALTH INSURANCE | Admitting: Family Medicine

## 2024-01-07 VITALS — BP 142/98 | HR 70 | Temp 98.2°F | Ht 63.0 in | Wt 150.0 lb

## 2024-01-07 DIAGNOSIS — R768 Other specified abnormal immunological findings in serum: Secondary | ICD-10-CM | POA: Diagnosis not present

## 2024-01-07 DIAGNOSIS — H6122 Impacted cerumen, left ear: Secondary | ICD-10-CM | POA: Diagnosis not present

## 2024-01-07 DIAGNOSIS — N289 Disorder of kidney and ureter, unspecified: Secondary | ICD-10-CM | POA: Diagnosis not present

## 2024-01-07 DIAGNOSIS — I10 Essential (primary) hypertension: Secondary | ICD-10-CM | POA: Insufficient documentation

## 2024-01-07 LAB — COMPREHENSIVE METABOLIC PANEL WITH GFR
ALT: 7 U/L (ref 0–35)
AST: 11 U/L (ref 0–37)
Albumin: 3.8 g/dL (ref 3.5–5.2)
Alkaline Phosphatase: 52 U/L (ref 39–117)
BUN: 10 mg/dL (ref 6–23)
CO2: 28 meq/L (ref 19–32)
Calcium: 8.9 mg/dL (ref 8.4–10.5)
Chloride: 105 meq/L (ref 96–112)
Creatinine, Ser: 0.74 mg/dL (ref 0.40–1.20)
GFR: 99.12 mL/min (ref >60.00)
Glucose, Bld: 80 mg/dL (ref 70–99)
Potassium: 3.6 meq/L (ref 3.5–5.1)
Sodium: 138 meq/L (ref 135–145)
Total Bilirubin: 0.3 mg/dL (ref 0.2–1.2)
Total Protein: 7.1 g/dL (ref 6.0–8.3)

## 2024-01-07 MED ORDER — OLMESARTAN MEDOXOMIL 20 MG PO TABS: 20.0000 mg | ORAL_TABLET | Freq: Every day | ORAL | 0 refills | Status: AC

## 2024-01-07 NOTE — Progress Notes (Signed)
 Established Patient Office Visit   Subjective:  Patient ID: [name], female    DOB: [birth year]  Age: 43 y.o. MRN: [id number]  Chief Complaint  Patient presents with   Medical Management of Chronic Issues    2 week follow up     HPI Patient is present for a two week follow up on several concerns. On previous appointment, patients blood pressure was elevated (130/92). Prior to that she had another elevated reading at urgent care. She does not take any blood pressure medication. She was recommended to monitor her blood pressure for two weeks at home. She has brought in a log with blood pressures ranging 125-160/83-94. She has been having lower extremity edema and dizziness.   She had labs drawn for a reason for multiple joint pain. She had a positive ANA. She has agreed to a referral to rheumatology for further evaluation of autoimmune disorder.   Left ear impaction: On previous visit, left ear lavage was attempted and was unsuccessful. Recommended to apply OTC Debrox ear drops in ear the night before appointment. However, patient forgot, but would like to see if ear can be cleansed.   On previous CMP, kidney function was slightly low. Recommended patient to hydrate very well and will need to recheck kidney function today.   ROS See HPI above     Objective:   BP (!) 142/98   Pulse 70   Temp 98.2 F (36.8 C) (Oral)   Ht 5' 3 (1.6 m)   Wt 150 lb (68 kg)   LMP 11/30/2023 (Approximate)   SpO2 97%   BMI 26.57 kg/m  BP Readings from Last 3 Encounters:  01/07/24 (!) 142/98  12/24/23 (!) 130/92  12/11/23 (!) 152/82      Physical Exam Vitals reviewed.  Constitutional:      General: She is not in acute distress.    Appearance: Normal appearance. She is not ill-appearing, toxic-appearing or diaphoretic.  HENT:     Left Ear: There is impacted cerumen.  Eyes:     General:        Right eye: No discharge.        Left eye: No discharge.     Conjunctiva/sclera:  Conjunctivae normal.  Cardiovascular:     Rate and Rhythm: Normal rate and regular rhythm.     Heart sounds: Normal heart sounds. No murmur heard.    No friction rub. No gallop.  Pulmonary:     Effort: Pulmonary effort is normal. No respiratory distress.     Breath sounds: Normal breath sounds.  Musculoskeletal:        General: Normal range of motion.  Skin:    General: Skin is warm and dry.  Neurological:     General: No focal deficit present.     Mental Status: She is alert and oriented to person, place, and time. Mental status is at baseline.  Psychiatric:        Mood and Affect: Mood normal.        Behavior: Behavior normal.        Thought Content: Thought content normal.        Judgment: Judgment normal.   PRE-PROCEDURE EXAM: Left TM cannot be visualized due to total occlusion/impaction of the ear canal. PROCEDURE INDICATION: Remove wax to visualize ear drum & relieve discomfort CONSENT:  Verbal  PROCEDURE NOTE: Left ear: The CMA, Kyleigh Sigmon, irrigated both ears with warm water and ear drops to remove the wax. 100% of the wax was  removed.  POST- PROCEDURE EXAM: TM successfully visualized. TM with no erythema. The patient tolerated the procedure well.     Assessment & Plan:  Positive ANA (antinuclear antibody) -     Ambulatory referral to Rheumatology  Function kidney decreased -     Comprehensive metabolic panel with GFR  Primary hypertension Assessment & Plan: -Blood pressures are elevated in office and at home, will start medication management for elevated blood pressure. Prescribed Olmesartan  20mg  daily. Recommend to start taking medication every night before bed. Recommend to monitor blood pressure twice a day and bring in readings in 2 weeks at follow up appointment.   Orders: -     Olmesartan  Medoxomil; Take 1 tablet (20 mg total) by mouth daily.  Dispense: 30 tablet; Refill: 0  Impacted cerumen, left ear  -Placed a referral to rheumatology for positive ANA.  Please call the office or send a MyChart message if you do not receive a phone call or a MyChart message about appointment in 2 weeks.  -Ordered CMP to reassess kidney function. Office will call with lab results and will be available on MyChart.  -Left ear lavage completed today. Successful   Return in about 2 weeks (around 01/21/2024) for follow-up.   Keiyon Plack, NP

## 2024-01-07 NOTE — Patient Instructions (Addendum)
-  It was great to see you today.  -Blood pressures are elevated in office and at home, will start medication management for elevated blood pressure. Prescribed Olmesartan  20mg  daily. Recommend to start taking medication every night before bed. Recommend to monitor blood pressure twice a day and bring in readings in 2 weeks at follow up appointment.  -Placed a referral to rheumatology for positive ANA. Please call the office or send a MyChart message if you do not receive a phone call or a MyChart message about appointment in 2 weeks.  -Ordered CMP to reassess kidney function. Office will call with lab results and will be available on MyChart.  -Left ear lavage completed today. Successful  -Follow up in 2 weeks.

## 2024-01-07 NOTE — Assessment & Plan Note (Signed)
-  Blood pressures are elevated in office and at home, will start medication management for elevated blood pressure. Prescribed Olmesartan  20mg  daily. Recommend to start taking medication every night before bed. Recommend to monitor blood pressure twice a day and bring in readings in 2 weeks at follow up appointment.

## 2024-01-08 ENCOUNTER — Ambulatory Visit: Payer: Self-pay | Admitting: Family Medicine

## 2024-01-22 ENCOUNTER — Ambulatory Visit: Payer: PRIVATE HEALTH INSURANCE | Admitting: Family Medicine

## 2024-01-22 NOTE — Progress Notes (Deleted)
   Established Patient Office Visit   Subjective:  Patient ID: [name], female    DOB: [birth year]  Age: 43 y.o. MRN: [id number]  No chief complaint on file.   HPI HTN: On previous appointment, patient started Olmesartan  20mg  daily. Recommended to monitor BP BID for 2 weeks.  ROS See HPI above     Objective:     LMP 11/30/2023 (Approximate)  {Vitals History (Optional):23777}  Physical Exam  No results found for any visits on 01/22/24.  The ASCVD Risk score (Arnett DK, et al., 2019) failed to calculate for the following reasons:   Cannot find a previous HDL lab   Cannot find a previous total cholesterol lab    Assessment & Plan:  Primary hypertension    No follow-ups on file.   Oval Moralez, NP

## 2024-01-23 ENCOUNTER — Other Ambulatory Visit: Payer: Self-pay | Admitting: Nurse Practitioner

## 2024-01-23 DIAGNOSIS — D508 Other iron deficiency anemias: Secondary | ICD-10-CM

## 2024-01-30 ENCOUNTER — Other Ambulatory Visit: Payer: Self-pay | Admitting: Nurse Practitioner

## 2024-01-30 DIAGNOSIS — D508 Other iron deficiency anemias: Secondary | ICD-10-CM

## 2024-01-30 NOTE — Progress Notes (Deleted)
 [facility]   [phone] [phone]   Clinic New consult Note   Patient Care Team: Billy Philippe SAUNDERS, NP as PCP - General (Family Medicine) 01/30/2024  CHIEF COMPLAINTS/PURPOSE OF CONSULTATION:  Iron  deficiency anemia, referred Philippe Billy, NP  HISTORY OF PRESENTING ILLNESS:  [name] 43 y.o. female with PMH including HTN, AKI, dental infections, and menorrhagia is here because of iron  deficiency anemia.  First available CBC in epic 03/12/2008 shows anemia with Hgb 10.7, HCT 33.8, low MCV 76.3. Hgb remained  in the *** range until she presented to ED with ice cravings 06/14/22 found to have severe IDA with hgb 6.2, MCV 58.5, plt 725K, serum iron  13, 3% saturation, and ferritin 2. Transfused 2 units packed red cells. She was started on oral iron  and recommended to f/up with ob/gyn.  She presented back to ED 07/30/22 with abdominal pain and bloody stools.  Hgb 11.7, FOBT positive. CT concerning for infectious or inflammatory enterocolitis, treated with antibiotics and referred to GI outpatient.  Has been taking oral iron  occasionally.  Got a new job and underwent whole physical for work, labs 12/24/2023 showed Hgb 10.2, MCV 65.9, platelet 491, serum iron  14, 4% saturation, and a ferritin of 7.  SCR 1.21. TSH, B12 (528) normal.  ANA found to be positive with elevated sed rate   ***She was found to have abnormal CBC from *** ***She denies recent chest pain on exertion, shortness of breath on minimal exertion, pre-syncopal episodes, or palpitations. ***She had not noticed any recent bleeding such as epistaxis, hematuria or hematochezia ***The patient denies over the counter NSAID ingestion. She is not *** on antiplatelets agents. Her last colonoscopy was *** ***She had no prior history or diagnosis of cancer. Her age appropriate screening programs are up-to-date. ***She denies any pica and eats a variety of diet. ***She never donated blood or received  blood transfusion ***The patient was prescribed oral iron  supplements and she takes ***  MEDICAL HISTORY:  Past Medical History:  Diagnosis Date   Anemia    Colitis    High blood pressure    Spasms of the hands or feet     SURGICAL HISTORY: Past Surgical History:  Procedure Laterality Date   CESAREAN SECTION     OVARIAN CYST REMOVAL      SOCIAL HISTORY: Social History   Socioeconomic History   Marital status: Married    Spouse name: Not on file   Number of children: Not on file   Years of education: Not on file   Highest education level: Not on file  Occupational History   Not on file  Tobacco Use   Smoking status: Every Day    Current packs/day: 0.50    Types: Cigarettes, Cigars   Smokeless tobacco: Never   Tobacco comments:    5-7 cigarettes a day  Vaping Use   Vaping status: Never Used  Substance and Sexual Activity   Alcohol use: Yes    Comment: rarely   Drug use: Yes    Types: Marijuana   Sexual activity: Yes    Birth control/protection: None  Other Topics Concern   Not on file  Social History Narrative   Not on file   Social Drivers of Health   Financial Resource Strain: Low Risk  (12/24/2023)   Overall Financial Resource Strain (CARDIA)    Difficulty of Paying Living Expenses: Not hard at all  Food Insecurity: No Food Insecurity (06/14/2022)   Hunger Vital Sign  Worried About Programme researcher, broadcasting/film/video in the Last Year: Never true    Ran Out of Food in the Last Year: Never true  Transportation Needs: No Transportation Needs (06/14/2022)   PRAPARE - Administrator, Civil Service (Medical): No    Lack of Transportation (Non-Medical): No  Physical Activity: Inactive (12/24/2023)   Exercise Vital Sign    Days of Exercise per Week: 0 days    Minutes of Exercise per Session: 0 min  Stress: No Stress Concern Present (12/24/2023)   [employer] of Occupational Health - Occupational Stress Questionnaire    Feeling of Stress: Only a little   Social Connections: Moderately Integrated (12/24/2023)   Social Connection and Isolation Panel    Frequency of Communication with Friends and Family: More than three times a week    Frequency of Social Gatherings with Friends and Family: Twice a week    Attends Religious Services: 1 to 4 times per year    Active Member of [employer] or Organizations: No    Attends Banker Meetings: Never    Marital Status: Married  Catering manager Violence: Not At Risk (06/14/2022)   Humiliation, Afraid, Rape, and Kick questionnaire    Fear of Current or Ex-Partner: No    Emotionally Abused: No    Physically Abused: No    Sexually Abused: No    FAMILY HISTORY: Family History  Problem Relation Age of Onset   Hypertension Mother    Diabetes Mother    Hypertension Father    Breast cancer Maternal Grandmother     ALLERGIES:  is allergic to amoxicillin  and hydrocodone .  MEDICATIONS:  Current Outpatient Medications  Medication Sig Dispense Refill   acetaminophen  (TYLENOL ) 500 MG tablet Take 1,000 mg by mouth as needed for moderate pain (pain score 4-6) or headache.     ferrous gluconate  (FERGON) 324 MG tablet Take 324 mg by mouth daily with breakfast.     olmesartan  (BENICAR ) 20 MG tablet Take 1 tablet (20 mg total) by mouth daily. 30 tablet 0   No current facility-administered medications for this visit.    REVIEW OF SYSTEMS:   Constitutional: Denies fevers, chills or abnormal night sweats Eyes: Denies blurriness of vision, double vision or watery eyes Ears, nose, mouth, throat, and face: Denies mucositis or sore throat Respiratory: Denies cough, dyspnea or wheezes Cardiovascular: Denies palpitation, chest discomfort or lower extremity swelling Gastrointestinal:  Denies nausea, heartburn or change in bowel habits Skin: Denies abnormal skin rashes Lymphatics: Denies new lymphadenopathy or easy bruising Neurological:Denies numbness, tingling or new weaknesses Behavioral/Psych: Mood is  stable, no new changes  All other systems were reviewed with the patient and are negative.  PHYSICAL EXAMINATION: ECOG PERFORMANCE STATUS: {CHL ONC ECOG PS:[id number]}  There were no vitals filed for this visit. There were no vitals filed for this visit.  GENERAL:alert, no distress and comfortable SKIN: skin color, texture, turgor are normal, no rashes or significant lesions EYES: normal, conjunctiva are pink and non-injected, sclera clear OROPHARYNX:no exudate, no erythema and lips, buccal mucosa, and tongue normal  NECK: supple, thyroid  normal size, non-tender, without nodularity LYMPH:  no palpable lymphadenopathy in the cervical, axillary or inguinal LUNGS: clear to auscultation and percussion with normal breathing effort HEART: regular rate & rhythm and no murmurs and no lower extremity edema ABDOMEN:abdomen soft, non-tender and normal bowel sounds Musculoskeletal:no cyanosis of digits and no clubbing  PSYCH: alert & oriented x 3 with fluent speech NEURO: no focal motor/sensory deficits  LABORATORY  DATA:  I have reviewed the data as listed    Latest Ref Rng & Units 12/24/2023   11:44 AM 05/07/2023    8:24 AM 08/30/2022   11:24 AM  CBC  WBC 4.0 - 10.5 K/uL 4.4  5.8  5.4   Hemoglobin 12.0 - 15.0 g/dL 89.9  89.2  87.8   Hematocrit 36.0 - 46.0 % 32.8  35.1  40.2   Platelets 150.0 - 400.0 K/uL 491.0  411  419        Latest Ref Rng & Units 01/07/2024    9:19 AM 12/24/2023   11:44 AM 05/07/2023    8:24 AM  CMP  Glucose 70 - 99 mg/dL 80  84  99   BUN 6 - 23 mg/dL 10  13  7    Creatinine 0.40 - 1.20 mg/dL 9.25  8.78  9.10   Sodium 135 - 145 mEq/L 138  139  137   Potassium 3.5 - 5.1 mEq/L 3.6  4.4  3.7   Chloride 96 - 112 mEq/L 105  107  109   CO2 19 - 32 mEq/L 28  27  21    Calcium 8.4 - 10.5 mg/dL 8.9  9.4  9.0   Total Protein 6.0 - 8.3 g/dL 7.1  7.1    Total Bilirubin 0.2 - 1.2 mg/dL 0.3  0.3    Alkaline Phos 39 - 117 U/L 52  55    AST 0 - 37 U/L 11  14    ALT 0 - 35 U/L 7   9       RADIOGRAPHIC STUDIES: I have personally reviewed the radiological images as listed and agreed with the findings in the report. No results found.  ASSESSMENT & PLAN:  No problem-specific Assessment & Plan notes found for this encounter.    All questions were answered. The patient knows to call the clinic with any problems, questions or concerns. I spent {CHL ONC TIME VISIT - DTPQU:8845999869} counseling the patient face to face. The total time spent in the appointment was {CHL ONC TIME VISIT - DTPQU:8845999869} and more than 50% was on counseling.     Jerilee Space K Marzella Miracle, NP 01/30/24 11:46 AM

## 2024-01-31 ENCOUNTER — Inpatient Hospital Stay: Payer: PRIVATE HEALTH INSURANCE | Admitting: Nurse Practitioner

## 2024-01-31 ENCOUNTER — Inpatient Hospital Stay: Payer: PRIVATE HEALTH INSURANCE | Attending: Nurse Practitioner

## 2024-01-31 NOTE — Progress Notes (Deleted)
 Office Visit Note  Patient: [name]             Date of Birth: [birth year]           MRN: [id number]             PCP: Billy Philippe SAUNDERS, NP Referring: Billy Philippe SAUNDERS, NP Visit Date: 01/31/2024 Occupation: @GUAROCC @  Subjective:  No chief complaint on file.    History of Present Illness: [name] is a 43 y.o. female ***   Activities of Daily Living:  Patient reports morning stiffness for *** {minute/hour:19697}.   Patient {ACTIONS;DENIES/REPORTS:21021675::Denies} nocturnal pain.  Difficulty dressing/grooming: {ACTIONS;DENIES/REPORTS:21021675::Denies} Difficulty climbing stairs: {ACTIONS;DENIES/REPORTS:21021675::Denies} Difficulty getting out of chair: {ACTIONS;DENIES/REPORTS:21021675::Denies} Difficulty using hands for taps, buttons, cutlery, and/or writing: {ACTIONS;DENIES/REPORTS:21021675::Denies}  No Rheumatology ROS completed.    Rheum History: # Diagnosed in ***.  Manifestation of disease:   Serologies: (+) *** (-) ***  Maintenance Labs: QuantiFERON: *** Hepatitis panel: ***  Current Treatment ***  Prior Treatments ***   PMFS History:  Patient Active Problem List   Diagnosis Date Noted   Primary hypertension 01/07/2024   Acute on chronic blood loss anemia 06/14/2022   Iron  deficiency anemia 06/14/2022   Menorrhagia 06/14/2022   Dental abscess 06/14/2022   Thrombocytosis 06/14/2022   AKI (acute kidney injury) (HCC) 06/14/2022   Nondisplaced fracture of distal phalanx of left great toe, subsequent encounter for fracture with routine healing 09/25/2019   LOSS OF WEIGHT 02/20/2008    Past Medical History:  Diagnosis Date   Anemia    Colitis    High blood pressure    Spasms of the hands or feet     Family History  Problem Relation Age of Onset   Hypertension Mother    Diabetes Mother    Hypertension Father    Breast cancer Maternal Grandmother    Past Surgical History:  Procedure Laterality Date   CESAREAN  SECTION     OVARIAN CYST REMOVAL     Social History   Social History Narrative   Not on file   Immunization History  Administered Date(s) Administered   Hepb-cpg 01/09/2022   MMR 07/13/2021   Moderna Sars-Covid-2 Vaccination 01/26/2020, 02/22/2020   Tdap 04/11/2016, 07/23/2020     Objective: Vital Signs: There were no vitals taken for this visit.   Physical Exam   Musculoskeletal Exam: ***  CDAI Exam: CDAI Score: -- Patient Global: --; Provider Global: -- Swollen: --; Tender: -- Joint Exam 01/31/2024   No joint exam has been documented for this visit   There is currently no information documented on the homunculus. Go to the Rheumatology activity and complete the homunculus joint exam.  Investigation: No additional findings.  Imaging: No results found.  Recent Labs: Lab Results  Component Value Date   WBC 4.4 12/24/2023   HGB 10.0 (L) 12/24/2023   PLT 491.0 (H) 12/24/2023   NA 138 01/07/2024   K 3.6 01/07/2024   CL 105 01/07/2024   CO2 28 01/07/2024   GLUCOSE 80 01/07/2024   BUN 10 01/07/2024   CREATININE 0.74 01/07/2024   BILITOT 0.3 01/07/2024   ALKPHOS 52 01/07/2024   AST 11 01/07/2024   ALT 7 01/07/2024   PROT 7.1 01/07/2024   ALBUMIN 3.8 01/07/2024   CALCIUM 8.9 01/07/2024   GFRAA >60 01/29/2017    Speciality Comments: No specialty comments available.  Procedures:  No procedures performed Allergies: Amoxicillin  and Hydrocodone    Assessment / Plan:     Visit Diagnoses: No diagnosis  found.  #High risk medication use  Orders: No orders of the defined types were placed in this encounter.  No orders of the defined types were placed in this encounter.   Face-to-face time spent with patient was *** minutes. Greater than 50% of time was spent in counseling and coordination of care.  Follow-Up Instructions: No follow-ups on file.   Asberry Claw, DO
